# Patient Record
Sex: Male | Born: 1960 | Race: White | Hispanic: No | Marital: Married | Smoking: Never smoker
Health system: Southern US, Community
[De-identification: ages and names within clinical notes are randomized; demographics above are authoritative.]

## PROBLEM LIST (undated history)

## (undated) DIAGNOSIS — M722 Plantar fascial fibromatosis: Secondary | ICD-10-CM

## (undated) DIAGNOSIS — C61 Malignant neoplasm of prostate: Secondary | ICD-10-CM

## (undated) DIAGNOSIS — D869 Sarcoidosis, unspecified: Secondary | ICD-10-CM

## (undated) DIAGNOSIS — N529 Male erectile dysfunction, unspecified: Secondary | ICD-10-CM

## (undated) DIAGNOSIS — R7989 Other specified abnormal findings of blood chemistry: Secondary | ICD-10-CM

## (undated) HISTORY — DX: Male erectile dysfunction, unspecified: N52.9

## (undated) HISTORY — DX: Malignant neoplasm of prostate: C61

## (undated) HISTORY — PX: EYE SURGERY: SHX253

## (undated) HISTORY — DX: Sarcoidosis, unspecified: D86.9

## (undated) HISTORY — DX: Other specified abnormal findings of blood chemistry: R79.89

## (undated) HISTORY — DX: Plantar fascial fibromatosis: M72.2

## (undated) HISTORY — PX: LUNG BIOPSY: SHX232

---

## 1998-09-14 ENCOUNTER — Observation Stay (HOSPITAL_COMMUNITY): Admission: EM | Admit: 1998-09-14 | Discharge: 1998-09-15 | Payer: Self-pay | Admitting: Emergency Medicine

## 1998-09-14 ENCOUNTER — Encounter: Payer: Self-pay | Admitting: Emergency Medicine

## 2008-07-06 ENCOUNTER — Emergency Department (HOSPITAL_COMMUNITY): Admission: EM | Admit: 2008-07-06 | Discharge: 2008-07-07 | Payer: Self-pay | Admitting: Emergency Medicine

## 2008-07-09 ENCOUNTER — Encounter (INDEPENDENT_AMBULATORY_CARE_PROVIDER_SITE_OTHER): Payer: Self-pay | Admitting: *Deleted

## 2008-07-17 ENCOUNTER — Encounter: Payer: Self-pay | Admitting: Family Medicine

## 2008-07-18 ENCOUNTER — Encounter: Admission: RE | Admit: 2008-07-18 | Discharge: 2008-07-18 | Payer: Self-pay | Admitting: Family Medicine

## 2008-07-18 ENCOUNTER — Encounter: Payer: Self-pay | Admitting: Family Medicine

## 2008-08-05 ENCOUNTER — Encounter: Payer: Self-pay | Admitting: Family Medicine

## 2008-08-05 ENCOUNTER — Ambulatory Visit: Payer: Self-pay | Admitting: Thoracic Surgery

## 2008-08-13 ENCOUNTER — Ambulatory Visit (HOSPITAL_COMMUNITY): Admission: RE | Admit: 2008-08-13 | Discharge: 2008-08-13 | Payer: Self-pay | Admitting: Thoracic Surgery

## 2008-08-13 ENCOUNTER — Encounter: Payer: Self-pay | Admitting: Thoracic Surgery

## 2008-08-13 ENCOUNTER — Ambulatory Visit: Payer: Self-pay | Admitting: Thoracic Surgery

## 2008-08-15 ENCOUNTER — Ambulatory Visit: Payer: Self-pay | Admitting: Thoracic Surgery

## 2008-08-15 ENCOUNTER — Encounter: Payer: Self-pay | Admitting: Family Medicine

## 2008-09-16 ENCOUNTER — Ambulatory Visit: Payer: Self-pay | Admitting: Thoracic Surgery

## 2009-04-11 DIAGNOSIS — R7989 Other specified abnormal findings of blood chemistry: Secondary | ICD-10-CM

## 2009-04-11 HISTORY — DX: Other specified abnormal findings of blood chemistry: R79.89

## 2010-01-18 ENCOUNTER — Ambulatory Visit: Payer: Self-pay | Admitting: Family Medicine

## 2010-01-18 DIAGNOSIS — D869 Sarcoidosis, unspecified: Secondary | ICD-10-CM

## 2010-01-18 DIAGNOSIS — M722 Plantar fascial fibromatosis: Secondary | ICD-10-CM

## 2010-01-18 DIAGNOSIS — R6882 Decreased libido: Secondary | ICD-10-CM

## 2010-01-20 LAB — CONVERTED CEMR LAB
TSH: 0.68 microintl units/mL (ref 0.35–5.50)
Testosterone: 278.87 ng/dL — ABNORMAL LOW (ref 350.00–890.00)

## 2010-01-22 ENCOUNTER — Ambulatory Visit: Payer: Self-pay | Admitting: Family Medicine

## 2010-01-22 DIAGNOSIS — E291 Testicular hypofunction: Secondary | ICD-10-CM | POA: Insufficient documentation

## 2010-01-25 LAB — CONVERTED CEMR LAB
ALT: 38 units/L (ref 0–53)
AST: 27 units/L (ref 0–37)
Basophils Relative: 0.5 % (ref 0.0–3.0)
Bilirubin, Direct: 0.1 mg/dL (ref 0.0–0.3)
Chloride: 106 meq/L (ref 96–112)
Eosinophils Relative: 3.8 % (ref 0.0–5.0)
HCT: 45.6 % (ref 39.0–52.0)
Hemoglobin: 15.7 g/dL (ref 13.0–17.0)
Lymphs Abs: 1.8 10*3/uL (ref 0.7–4.0)
MCV: 95.8 fL (ref 78.0–100.0)
Monocytes Absolute: 0.6 10*3/uL (ref 0.1–1.0)
PSA: 0.5 ng/mL (ref 0.10–4.00)
Potassium: 5.1 meq/L (ref 3.5–5.1)
RBC: 4.76 M/uL (ref 4.22–5.81)
Sodium: 141 meq/L (ref 135–145)
Total Protein: 7.2 g/dL (ref 6.0–8.3)
WBC: 4.7 10*3/uL (ref 4.5–10.5)

## 2010-01-26 ENCOUNTER — Ambulatory Visit: Payer: Self-pay | Admitting: Family Medicine

## 2010-03-30 ENCOUNTER — Telehealth: Payer: Self-pay | Admitting: Family Medicine

## 2010-04-28 ENCOUNTER — Other Ambulatory Visit: Payer: Self-pay | Admitting: Family Medicine

## 2010-04-28 ENCOUNTER — Ambulatory Visit
Admission: RE | Admit: 2010-04-28 | Discharge: 2010-04-28 | Payer: Self-pay | Source: Home / Self Care | Attending: Family Medicine | Admitting: Family Medicine

## 2010-04-28 LAB — CBC WITH DIFFERENTIAL/PLATELET
Basophils Absolute: 0.1 10*3/uL (ref 0.0–0.1)
Basophils Relative: 0.9 % (ref 0.0–3.0)
Eosinophils Absolute: 0.5 10*3/uL (ref 0.0–0.7)
Eosinophils Relative: 6.8 % — ABNORMAL HIGH (ref 0.0–5.0)
HCT: 46 % (ref 39.0–52.0)
Hemoglobin: 16 g/dL (ref 13.0–17.0)
Lymphocytes Relative: 38.3 % (ref 12.0–46.0)
Lymphs Abs: 2.9 10*3/uL (ref 0.7–4.0)
MCHC: 34.8 g/dL (ref 30.0–36.0)
MCV: 94.6 fl (ref 78.0–100.0)
Monocytes Absolute: 0.5 10*3/uL (ref 0.1–1.0)
Monocytes Relative: 6.9 % (ref 3.0–12.0)
Neutro Abs: 3.5 10*3/uL (ref 1.4–7.7)
Neutrophils Relative %: 47.1 % (ref 43.0–77.0)
Platelets: 146 10*3/uL — ABNORMAL LOW (ref 150.0–400.0)
RBC: 4.87 Mil/uL (ref 4.22–5.81)
RDW: 12.8 % (ref 11.5–14.6)
WBC: 7.5 10*3/uL (ref 4.5–10.5)

## 2010-04-28 LAB — HEPATIC FUNCTION PANEL
ALT: 63 U/L — ABNORMAL HIGH (ref 0–53)
AST: 38 U/L — ABNORMAL HIGH (ref 0–37)
Albumin: 4 g/dL (ref 3.5–5.2)
Alkaline Phosphatase: 62 U/L (ref 39–117)
Bilirubin, Direct: 0.1 mg/dL (ref 0.0–0.3)
Total Bilirubin: 0.7 mg/dL (ref 0.3–1.2)
Total Protein: 6.9 g/dL (ref 6.0–8.3)

## 2010-04-28 LAB — BASIC METABOLIC PANEL
BUN: 21 mg/dL (ref 6–23)
CO2: 29 mEq/L (ref 19–32)
Calcium: 9.6 mg/dL (ref 8.4–10.5)
Chloride: 108 mEq/L (ref 96–112)
Creatinine, Ser: 1.4 mg/dL (ref 0.4–1.5)
GFR: 55.57 mL/min — ABNORMAL LOW (ref >60.00)
Glucose, Bld: 108 mg/dL — ABNORMAL HIGH (ref 70–99)
Potassium: 4.5 mEq/L (ref 3.5–5.1)
Sodium: 142 mEq/L (ref 135–145)

## 2010-04-28 LAB — PSA: PSA: 0.68 ng/mL (ref 0.10–4.00)

## 2010-04-28 LAB — TESTOSTERONE: Testosterone: 186.78 ng/dL — ABNORMAL LOW (ref 350.00–890.00)

## 2010-04-29 ENCOUNTER — Telehealth: Payer: Self-pay | Admitting: Family Medicine

## 2010-05-04 ENCOUNTER — Telehealth: Payer: Self-pay | Admitting: Family Medicine

## 2010-05-11 NOTE — Letter (Signed)
Summary: Deboraha Sprang @ [place] [facility] [employer]   Imported By: Lanelle Bal 02/03/2010 11:36:59  _____________________________________________________________________  External Attachment:    Type:   Image     Comment:   External Document

## 2010-05-11 NOTE — Assessment & Plan Note (Signed)
Summary: TRANSFER FROM [place]   Vital Signs:  Patient profile:   50 year old male Height:      69.25 inches Weight:      175.75 pounds BMI:     25.86 Temp:     98.3 degrees F oral Pulse rate:   72 / minute Pulse rhythm:   regular BP sitting:   122 / 82  (left arm) Cuff size:   regular  Vitals Entered By: Sydell Axon LPN (January 18, 2010 8:03 AM) CC: New patient to get established   History of Present Illness: Transferring from [place].  H/o foot pain.  Plantar fasciitis.  No pain if he does plantar stretches in AM.    H/o sarcoid per Dr. Edwyna Shell.  No cough, FCNAV, weight loss.   Dec in libido since June.  Happily married but decrease in interest.  Function is still fairly good but drive is the issue.  d/w patient XB:JYNWGNFAOZHY testings today.   Past History:  Past Medical History: Sarcoid Plantar fasciitis low testosterone 2011  Past Surgical History: Surgery on right eye from injury  ~2000 Surgery to have biopsy on Lung 2010- dx'd with sarcoid- Dr. Edwyna Shell  Family History: Reviewed history and no changes required. F alive HTN M alive bone cancer, observation as of 2011  Social History: Reviewed history and no changes required. 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  no murmur PULM: ctab, no inc wob ABD: soft, +bs EXT: no edema SKIN: no acute rash  Genitalia:  Testes bilaterally descended without nodularity, tenderness or masses. No scrotal masses or lesions. No penis lesions or urethral discharge.   Impression & Recommendations:  Problem # 1:  LIBIDO, DECREASED (ICD-799.81) >25 min spent with patient, at least half of which was spent on counseling re:dx. See notes on labs.  Orders: TLB-Testosterone, Total  (84403-TESTO) TLB-TSH (Thyroid Stimulating Hormone) (84443-TSH)  Problem # 2:  PULMONARY SARCOIDOSIS (ICD-135) No symptoms.  Will review records and notify patient.   Complete Medication List: 1)  Ibuprofen 200 Mg Tabs (Ibuprofen) .... As needed  Other Orders: Admin 1st Vaccine (86578) Flu Vaccine 72yrs + [phone])  Patient Instructions: 1)  You can get your results through our phone system.  Follow the instructions on the blue card if you don't hear from [place] first.  2)  I would get a physical next summer.   3)  Take care.  Glad to see you.    Flu Vaccine Consent Questions     Do you have a history of severe allergic reactions to this vaccine? no    Any prior history of allergic reactions to egg and/or gelatin? no    Do you have a sensitivity to the preservative Thimersol? no    Do you have a past history of Guillan-Barre Syndrome? no    Do you currently have an acute febrile illness? no    Have you ever had a severe reaction to latex? no    Vaccine information given and explained to patient? yes    Are you currently pregnant? no    Lot Number:[id number]   Exp Date:10/09/2010   Site Given  Left Deltoid IMflu

## 2010-05-11 NOTE — Assessment & Plan Note (Signed)
Summary: FOLLOW UP FROM LABS/[facility]   Vital Signs:  Patient profile:   50 year old male Height:      69.25 inches Weight:      177.25 pounds BMI:     26.08 Temp:     98.3 degrees F oral Pulse rate:   84 / minute Pulse rhythm:   regular BP sitting:   120 / 74  (left arm) Cuff size:   regular  Vitals Entered By: Delilah Shan CMA Natha Guin Dull) (January 26, 2010 3:18 PM) CC: Follow-up visit from labs   History of Present Illness: Here to discuss labs and options.  Mild increase in glucose and decrease in PLT on labs.  This was preceeded by brief illness and I am unsure if this influenced labs.  See plan re: testosterone replacement.  Recent illness (URI and diarrhea) resolved after  ~24h.   Review of Systems       See HPI.  Otherwise negative.    Physical Exam  General:  A&O, remainder deferred.    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  I d/w pt options: injection, gel, patch.  We talked about adverse effects, not limited to mood change, LFT/CBC/PSA changes.  He opts for patch and I support this.   Will repeat cbc and glucose due to recent labs along with routine labs for med monitoring.  >25 min spent with patient, at least half of which was spent on counseling re:dx, labs, meds, options and plan.  He understands risks and options.  See instructions.   Complete Medication List: 1)  Ibuprofen 200 Mg Tabs (Ibuprofen) .... As needed 2)  Androderm 2.5 Mg/24hr Pt24 (Testosterone) .[facility].. 1 patch applied per day  Patient Instructions: 1)  I want you to come back for repeat fasting CBC, CMET, PSA, testosterone level in 1 months (after using the patches).  dx 257.2. 2)  Let me know if you have any rash or mood changes in the meantime.  3)  Take care.  Prescriptions: ANDRODERM 2.5 MG/24HR PT24 (TESTOSTERONE) 1 patch applied per day  #30 x 5   Entered and Authorized by:   Crawford Givens MD   Signed by:   Crawford Givens MD on 01/26/2010   Method used:   Print then Give to Patient   RxID:   [id number]    Orders Added: 1)  Est. Patient Level IV [56213]

## 2010-05-11 NOTE — Letter (Signed)
Summary: [facility]  [facility]   Imported By: Lanelle Bal 02/03/2010 11:34:19  _____________________________________________________________________  External Attachment:    Type:   Image     Comment:   External Document

## 2010-05-11 NOTE — Letter (Signed)
Summary: [facility]  [facility]   Imported By: Lanelle Bal 02/03/2010 11:33:34  _____________________________________________________________________  External Attachment:    Type:   Image     Comment:   External Document

## 2010-05-13 NOTE — Progress Notes (Signed)
Summary: Testosterone  Phone Note Call from Patient Call back at [phone]   Caller: Patient Call For: Crawford Givens MD Summary of Call: Patient was set up for his 1 month lab appt.  He now states that he will run out of his patches on 05/14/2010 because of doubling up on them.  He says that when he told you he had a month left, he wasn't taking into account that he would be doubling up.  Does he need a refill on the patches and keep the 06/04/2010 lab appt or get labs before 05/14/2010?   Also he says you told him that you didn't care what method he used and he is asking if he could get the gel since the patches are leaving a sticky residue? Initial call taken by: Delilah Shan CMA [name]),  May 04, 2010 11:43 AM  Follow-up for Phone Call        I don't have a preference about what the patient starts on (patch, gel, injection), but once they start on one, they need to stay with it.  I wouldn't change dose and formulation at the same time.  please call in rx for 1 month supply and recheck labs as planned. thanks.  Follow-up by: Crawford Givens MD,  May 04, 2010 11:49 AM  Additional Follow-up for Phone Call Additional follow up Details #1::        Patient Advised. Medication phoned to pharmacy.  Additional Follow-up by: Delilah Shan CMA (AAMA),  May 06, 2010 4:12 PM    Prescriptions: ANDRODERM 2 MG/24HR PT24 (TESTOSTERONE) Two patches applied to skin per day.  #60 x 0   Entered and Authorized by:   Crawford Givens MD   Signed by:   Crawford Givens MD on 05/04/2010   Method used:   Telephoned to ...       [facility] (retail)       [address]       [place], [place]  [address]       Ph: [phone]       Fax: [phone]   RxID:   [id number]

## 2010-05-13 NOTE — Progress Notes (Signed)
Summary: regarding andoderm  Phone Note Refill Request Message from:  Fax from Pharmacy  Refills Requested: Medication #1:  ANDRODERM 2.5 MG/24HR PT24 1 patch applied per day. Fax from [facility] in [place].  This strength has been discontinued and replaced with 2 mg per 24 hours.  Pharmacy is asking to switch to that dose.  Phone [phone]  Initial call taken by: Lowella Petties CMA, AAMA,  March 30, 2010 8:25 AM  Follow-up for Phone Call        please call in testosterone patch 2mg /24h.  apply 1 patch per day.  #30, 1rf.  please update med list.  Pt is due for fasting CBC, CMET, PSA, testosterone level (after using the patches).  dx 257.2.  Make sure it's an early AM sample.  Follow-up by: Crawford Givens MD,  March 30, 2010 10:17 AM  Additional Follow-up for Phone Call Additional follow up Details #1::        Pharmacy advised and Rx. submitted.  Med list updated.  Left message on voicemail  to return call. Lugene Fuquay CMA Duncan Dull)  March 30, 2010 12:11 PM   Patient Advised.  Patient says he will schedule labs at the beginning of the year.  He was advised to be sure to make it an early a.m. appt. Additional Follow-up by: Delilah Shan CMA Duncan Dull),  March 30, 2010 12:36 PM    New/Updated Medications: ANDRODERM 2.5 MG/24HR PT24 (TESTOSTERONE) Replaced with 2 mg./24 hour patch.  One patch applied to skin per day. Prescriptions: ANDRODERM 2.5 MG/24HR PT24 (TESTOSTERONE) Replaced with 2 mg./24 hour patch.  One patch applied to skin per day.  #30 x 1   Entered by:   Delilah Shan CMA (AAMA)   Authorized by:   Crawford Givens MD   Signed by:   Delilah Shan CMA (AAMA) on 03/30/2010   Method used:   Telephoned to ...       [facility] (retail)       [address]       [place], [place]  [address]       Ph: [phone]       Fax: [phone]   RxID:   [id number]

## 2010-05-13 NOTE — Progress Notes (Signed)
  Phone Note Outgoing Call   Summary of Call: I called patient.  Libido was better on the 2.5mg  patch, but it dropped off when he was changed (due to pharmacy repackaging) to the 2mg  patch.  I talked to him about his labs.  His LFTs are minimall elevated.  We agreed to recheck testosterone in 1 month along with LFTs.  He'll increase to 2 of the patches in the meantime.  Please call him to set up lab visit for 1 month from now.  AM testosterone level and LFTs.  dx. 257.2.  thanks.   Initial call taken by: Crawford Givens MD,  April 29, 2010 5:50 PM  Follow-up for Phone Call        Left message on voicemail  to return call. Lugene Fuquay CMA (AAMA)  April 30, 2010 1:02 PM   Patient Advised. Lab appointment scheduled  06/04/2010.   Lugene Fuquay CMA (AAMA)  May 04, 2010 11:40 AM     New/Updated Medications: * ANDRODERM 2 MG/24HR PT24 (TESTOSTERONE) Two patches applied to skin per day.

## 2010-06-04 ENCOUNTER — Encounter (INDEPENDENT_AMBULATORY_CARE_PROVIDER_SITE_OTHER): Payer: Self-pay | Admitting: *Deleted

## 2010-06-04 ENCOUNTER — Other Ambulatory Visit (INDEPENDENT_AMBULATORY_CARE_PROVIDER_SITE_OTHER): Payer: BC Managed Care – PPO

## 2010-06-04 ENCOUNTER — Other Ambulatory Visit: Payer: Self-pay | Admitting: Family Medicine

## 2010-06-04 DIAGNOSIS — R6882 Decreased libido: Secondary | ICD-10-CM

## 2010-06-04 DIAGNOSIS — E291 Testicular hypofunction: Secondary | ICD-10-CM

## 2010-06-04 LAB — HEPATIC FUNCTION PANEL
ALT: 35 U/L (ref 0–53)
Albumin: 4.3 g/dL (ref 3.5–5.2)
Total Bilirubin: 1 mg/dL (ref 0.3–1.2)

## 2010-06-04 LAB — TESTOSTERONE: Testosterone: 250.52 ng/dL — ABNORMAL LOW (ref 350.00–890.00)

## 2010-07-07 ENCOUNTER — Other Ambulatory Visit: Payer: Self-pay | Admitting: Family Medicine

## 2010-07-07 DIAGNOSIS — E291 Testicular hypofunction: Secondary | ICD-10-CM

## 2010-07-08 ENCOUNTER — Other Ambulatory Visit (INDEPENDENT_AMBULATORY_CARE_PROVIDER_SITE_OTHER): Payer: BC Managed Care – PPO | Admitting: Family Medicine

## 2010-07-08 DIAGNOSIS — E291 Testicular hypofunction: Secondary | ICD-10-CM

## 2010-07-08 LAB — TESTOSTERONE: Testosterone: 157.46 ng/dL — ABNORMAL LOW (ref 350.00–890.00)

## 2010-07-08 NOTE — Progress Notes (Signed)
Addended by: Raechel Ache on: 07/08/2010 09:06 PM   Modules accepted: Orders

## 2010-07-09 ENCOUNTER — Telehealth: Payer: Self-pay | Admitting: *Deleted

## 2010-07-11 NOTE — Telephone Encounter (Signed)
It is the testosterone cream 5%.   I believe he has this compounded.  Please see the lab result for the sig and talk to me about this tomorrow.   Thanks.

## 2010-07-12 ENCOUNTER — Telehealth: Payer: Self-pay | Admitting: *Deleted

## 2010-07-12 NOTE — Telephone Encounter (Signed)
Rx  Left at front desk for pick up.

## 2010-07-12 NOTE — Telephone Encounter (Signed)
rx written for testosterone cream 5%, apply 1.5grams daily.  #45 grams, 1rf.

## 2010-07-20 LAB — AFB CULTURE WITH SMEAR (NOT AT ARMC)

## 2010-07-20 LAB — FUNGUS CULTURE W SMEAR: Fungal Smear: NONE SEEN

## 2010-07-20 LAB — CULTURE, RESPIRATORY W GRAM STAIN

## 2010-07-20 LAB — TYPE AND SCREEN

## 2010-07-21 LAB — COMPREHENSIVE METABOLIC PANEL
ALT: 33 U/L (ref 0–53)
AST: 22 U/L (ref 0–37)
Alkaline Phosphatase: 91 U/L (ref 39–117)
CO2: 26 mEq/L (ref 19–32)
Chloride: 106 mEq/L (ref 96–112)
GFR calc Af Amer: >60 mL/min (ref >60)
GFR calc non Af Amer: >60 mL/min (ref >60)
Glucose, Bld: 178 mg/dL — ABNORMAL HIGH (ref 70–99)
Potassium: 3.8 mEq/L (ref 3.5–5.1)
Sodium: 140 mEq/L (ref 135–145)
Total Bilirubin: 0.4 mg/dL (ref 0.3–1.2)

## 2010-07-21 LAB — CBC
Hemoglobin: 13.4 g/dL (ref 13.0–17.0)
MCHC: 34.6 g/dL (ref 30.0–36.0)
RBC: 4.2 MIL/uL — ABNORMAL LOW (ref 4.22–5.81)
WBC: 6.5 10*3/uL (ref 4.0–10.5)

## 2010-07-21 LAB — PROTIME-INR: Prothrombin Time: 13.7 seconds (ref 11.6–15.2)

## 2010-07-22 LAB — CBC
HCT: 45.8 % (ref 39.0–52.0)
MCHC: 35.4 g/dL (ref 30.0–36.0)
MCV: 93.7 fL (ref 78.0–100.0)
Platelets: 232 10*3/uL (ref 150–400)
WBC: 10.2 10*3/uL (ref 4.0–10.5)

## 2010-07-22 LAB — COMPREHENSIVE METABOLIC PANEL
AST: 28 U/L (ref 0–37)
Albumin: 3.8 g/dL (ref 3.5–5.2)
BUN: 19 mg/dL (ref 6–23)
Calcium: 9.1 mg/dL (ref 8.4–10.5)
Chloride: 107 mEq/L (ref 96–112)
Creatinine, Ser: 0.96 mg/dL (ref 0.4–1.5)
GFR calc Af Amer: >60 mL/min (ref >60)
GFR calc non Af Amer: >60 mL/min (ref >60)
Total Bilirubin: 1 mg/dL (ref 0.3–1.2)

## 2010-07-22 LAB — LIPASE, BLOOD: Lipase: 18 U/L (ref 11–59)

## 2010-07-22 LAB — POCT CARDIAC MARKERS
CKMB, poc: <1 ng/mL — ABNORMAL LOW (ref 1.0–8.0)
Myoglobin, poc: 53.8 ng/mL (ref 12–200)

## 2010-07-22 LAB — DIFFERENTIAL
Basophils Absolute: 0 10*3/uL (ref 0.0–0.1)
Lymphocytes Relative: 18 % (ref 12–46)
Lymphs Abs: 1.8 10*3/uL (ref 0.7–4.0)
Neutro Abs: 7.1 10*3/uL (ref 1.7–7.7)

## 2010-07-22 LAB — URINALYSIS, ROUTINE W REFLEX MICROSCOPIC
Nitrite: NEGATIVE
Specific Gravity, Urine: 1.023 (ref 1.005–1.030)
Urobilinogen, UA: 1 mg/dL (ref 0.0–1.0)
pH: 6.5 (ref 5.0–8.0)

## 2010-08-11 ENCOUNTER — Other Ambulatory Visit (INDEPENDENT_AMBULATORY_CARE_PROVIDER_SITE_OTHER): Payer: BC Managed Care – PPO | Admitting: Family Medicine

## 2010-08-11 DIAGNOSIS — E291 Testicular hypofunction: Secondary | ICD-10-CM

## 2010-08-12 ENCOUNTER — Other Ambulatory Visit: Payer: Self-pay | Admitting: Family Medicine

## 2010-08-12 ENCOUNTER — Other Ambulatory Visit: Payer: Self-pay | Admitting: *Deleted

## 2010-08-12 DIAGNOSIS — Z5181 Encounter for therapeutic drug level monitoring: Secondary | ICD-10-CM

## 2010-08-13 ENCOUNTER — Encounter: Payer: Self-pay | Admitting: Family Medicine

## 2010-08-24 NOTE — Op Note (Signed)
NAMEJASEN, Dustin Nixon               ACCOUNT NO.:  [id number]   MEDICAL RECORD NO.:  [id number]          PATIENT TYPE:  AMB   LOCATION:  SDS                          FACILITY:  [facility]   PHYSICIAN:  Ines Bloomer, M.D. DATE OF BIRTH:  1960/05/22   DATE OF PROCEDURE:  08/13/2008  DATE OF DISCHARGE:  08/13/2008                               OPERATIVE REPORT   PREOPERATIVE DIAGNOSIS:  Mediastinal adenopathy.   POSTOPERATIVE DIAGNOSIS:  Mediastinal adenopathy.   OPERATION PERFORMED:  Fiberoptic bronchoscopy, mediastinoscopy, and  endobronchial ultrasound.   After general anesthesia, the video bronchoscope was passed through the  endotracheal tube.  The carina was in the midline and left main stem  bronchus was normal with the left upper lobe at a large takeoff of the  left lingula, which was abnormally enlarged.  On the right side, there  some extra takeoff of the right upper lobe and protruding from this  takeoff were small nodules, which were biopsied and looked to be  inflammatory, but the patient did have two takeoffs of the right upper  lobe with necessary takeoff.  On the right middle lobe and the right  lower lobe, the orifices were normal.  Washings were taken from this  area and cultures, and video bronchoscope was removed.  The EBUS scope  was inserted.  Using the EBUS scope, we identified 10R node to the  takeoff of the right upper lobe main bronchus, and at that time, below  the azygous vein as did 2 aspirations, which showed no cancer, but there  was some questionable granuloma.  We confirmed the diagnosis, the  anterior neck was prepped and draped in usual sterile manner.  A  transverse incision was made and carried down using electrocautery to  subcutaneous tissue and fascia.  The pretracheal fascia was entered and  biopsies of 4 nodes were done.  Strap muscles were closed with 2-0  Vicryl, subcutaneous tissue with 3-0 Vicryl, and Dermabond for the skin.  The patient  returned to recovery room in stable condition.      Ines Bloomer, M.D.  Electronically Signed     DPB/MEDQ  D:  08/13/2008  T:  08/14/2008  Job:  [id number]

## 2010-08-24 NOTE — Letter (Signed)
August 05, 2008   Dwana Curd. Para March, MD  [address]  [place], [place] [address]   Re:  DELDRICK, LINCH               DOB:  [birth year]   Dear Dr. Para March:   I saw the patient in the office today.  This 50 year old patient was  having some problems with plantar fasciitis and had a chest x-ray, which  was done and showed questionable hilar adenopathy.  A CT scan was done  that showed mediastinal and bilateral hilar adenopathy, which he has no  evidence of recent fever.  His weight has been stable.  He does not  smoke.  Because of this he is referred here for evaluation.  There also  were multiple small noncalcified nodules.  This would go along with some  type of infectious process versus sarcoidosis versus possible cancer.His  medications include ibuprofen and Valium.   ALLERGIES:  He has no allergies.   FAMILY HISTORY:  Noncontributory.   SOCIAL HISTORY:  He is married, has one child.  Works as a Merchandiser, retail.  He does not smoke or drink alcohol on a regular basis.   REVIEW OF SYSTEMS:  He is 170 pounds, 5 feet 11 inches.  CARDIAC:  No angina or atrial fibrillation.  PULMONARY:  He has had a cough.  GI:  He has had some dysphagia.  No reflux.  GU:  No kidney disease, dysuria.  VASCULAR:  He has got pain in his legs with walking, has been told he  had plantar fasciitis.  No DVT or TIAs.  NEUROLOGICAL:  No dizziness, headaches, blackouts, seizures.  MUSCULOSKELETAL:  No joint pain.  PSYCHIATRIC:  No depression or nervous.  EYE/ENT:  No changes in eyesight or hearing.  HEMATOLOGIC:  No problems with bleeding, clotting disorders, or anemia.   PHYSICAL EXAMINATION:  He is a well-developed Caucasian male in no acute  distress.  His blood pressure is 149/80, pulse 88, respirations 18,  saturations were 98%.  Head, eyes, ears, nose, and throat:  Unremarkable.  Neck:  Supple without thyromegaly.  There is no  supraclavicular or axillary adenopathy.  Chest:  Clear to auscultation  and percussion.  Heart:  Regular sinus rhythm.  No murmurs.  Abdomen:  Soft.  Extremities:  Pulses are 2+.  There is no clubbing or edema.   I feel he probably has sarcoidosis, but this could possibly be some type  of infectious process or a remote possibility of cancer.  I will plan to  do a bronchoscopy, endobronchial ultrasound, and  possible  mediastinoscopy on him on May 5.   I appreciate the opportunity of seeing the patient.   Sincerely,   Ines Bloomer, M.D.  Electronically Signed   DPB/MEDQ  D:  08/05/2008  T:  08/06/2008  Job:  [id number]

## 2010-08-24 NOTE — Assessment & Plan Note (Signed)
OFFICE VISIT   Dustin, Nixon  DOB:  Aug 13, 1960                                        September 16, 2008  CHART #:  [id number]   The patient came for followup today.  His mediastinoscopy incision is  well healed.  He has not gone back to see his primary care doctor, but  has a diagnosis of sarcoidosis.  He is doing well overall.   His blood pressure was 129/76, pulse 77, respirations 18, sats were 96%.   I encouraged him to follow up with his primary care doctor, and I will  have to see him again if he has any future problems.   Ines Bloomer, M.D.  Electronically Signed   DPB/MEDQ  D:  09/16/2008  T:  09/17/2008  Job:  [id number]   cc:   Dwana Curd. Para March, M.D.

## 2010-08-24 NOTE — Letter (Signed)
Aug 15, 2008   Dustin Nixon. Para March, MD  [address]  [place], [place] [address]   Re:  Dustin Nixon, Dustin Nixon               DOB:  Jun 24, 1960   Dear Dr. Para March:   I saw the patient today.  He is doing well after his bronchoscopy and  mediastinoscopy.  His biopsy shows sarcoidosis or granulomatous  lymphadenitis.  We of course checked cultures, but there were no  organisms seen.  I talked to him about sarcoidosis and referred him back  to you for decisions for his treatment.  It looks like he probably will  not require treatment.  Another option would be to send him to a  pulmonologist regarding his sarcoidosis, but I think this is probably  self-limiting.  I will see him back again in 3 weeks to check on the  healing of his mediastinoscopy wound.   Sincerely,   Ines Bloomer, M.D.  Electronically Signed   DPB/MEDQ  D:  08/15/2008  T:  08/15/2008  Job:  [id number]

## 2010-09-14 ENCOUNTER — Other Ambulatory Visit (INDEPENDENT_AMBULATORY_CARE_PROVIDER_SITE_OTHER): Payer: BC Managed Care – PPO | Admitting: Family Medicine

## 2010-09-14 DIAGNOSIS — Z5181 Encounter for therapeutic drug level monitoring: Secondary | ICD-10-CM

## 2010-09-14 LAB — COMPREHENSIVE METABOLIC PANEL
Alkaline Phosphatase: 59 U/L (ref 39–117)
BUN: 20 mg/dL (ref 6–23)
CO2: 27 mEq/L (ref 19–32)
Creatinine, Ser: 1 mg/dL (ref 0.4–1.5)
GFR: 81.67 mL/min (ref >60.00)
Glucose, Bld: 160 mg/dL — ABNORMAL HIGH (ref 70–99)
Total Bilirubin: 0.7 mg/dL (ref 0.3–1.2)

## 2010-09-14 LAB — CBC WITH DIFFERENTIAL/PLATELET
Basophils Relative: 0.9 % (ref 0.0–3.0)
Eosinophils Relative: 5.8 % — ABNORMAL HIGH (ref 0.0–5.0)
HCT: 49 % (ref 39.0–52.0)
Lymphs Abs: 2.6 10*3/uL (ref 0.7–4.0)
MCV: 95.3 fl (ref 78.0–100.0)
Monocytes Absolute: 0.5 10*3/uL (ref 0.1–1.0)
Monocytes Relative: 7.7 % (ref 3.0–12.0)
RBC: 5.14 Mil/uL (ref 4.22–5.81)
WBC: 6.4 10*3/uL (ref 4.5–10.5)

## 2010-09-14 LAB — TESTOSTERONE: Testosterone: 191.74 ng/dL — ABNORMAL LOW (ref 350.00–890.00)

## 2010-09-14 LAB — PSA: PSA: 0.78 ng/mL (ref 0.10–4.00)

## 2010-09-15 ENCOUNTER — Telehealth: Payer: Self-pay | Admitting: Family Medicine

## 2010-09-15 DIAGNOSIS — E291 Testicular hypofunction: Secondary | ICD-10-CM

## 2010-09-15 NOTE — Telephone Encounter (Signed)
Please call pt. His labs are fine except for the testosterone level.  It's still low. I would inc the testosterone dose.  Please notify patient.  Please call in the Testosterone 5% cream apply 3 grams per day. #90g, 5rf. I would recheck labs in 1 month. I put in the orders. Thanks.  Please adjust the med list.

## 2010-09-16 ENCOUNTER — Encounter: Payer: Self-pay | Admitting: Family Medicine

## 2010-09-16 NOTE — Telephone Encounter (Signed)
Patient notified. New Rx called into [place] as requested. Lab appt scheduled for 1 month.

## 2010-09-16 NOTE — Telephone Encounter (Signed)
Message left for patient to return my call.  

## 2010-10-15 ENCOUNTER — Other Ambulatory Visit (INDEPENDENT_AMBULATORY_CARE_PROVIDER_SITE_OTHER): Payer: BC Managed Care – PPO | Admitting: Family Medicine

## 2010-10-15 DIAGNOSIS — E291 Testicular hypofunction: Secondary | ICD-10-CM

## 2010-10-15 LAB — TESTOSTERONE: Testosterone: 186.47 ng/dL — ABNORMAL LOW (ref 350.00–890.00)

## 2010-10-25 NOTE — Progress Notes (Signed)
  Subjective:    Patient ID: Dustin Nixon, male    DOB: 11/25/1960, 50 y.o.   MRN: [id number]  HPI    Review of Systems     Objective:   Physical Exam        Assessment & Plan:  I called pt.  He agreed with the referral.  I ordered it.  App uro input.

## 2010-10-25 NOTE — Progress Notes (Signed)
Addended by: Lars Mage on: 10/25/2010 05:59 PM   Modules accepted: Orders

## 2011-04-06 ENCOUNTER — Encounter: Payer: Self-pay | Admitting: Family Medicine

## 2012-06-19 ENCOUNTER — Telehealth: Payer: Self-pay | Admitting: Family Medicine

## 2012-06-19 NOTE — Telephone Encounter (Signed)
Mr. Challis dropped off an insurance form to be filled out.

## 2012-06-19 NOTE — Telephone Encounter (Signed)
Form signed, please send requested info.

## 2012-12-04 ENCOUNTER — Ambulatory Visit (INDEPENDENT_AMBULATORY_CARE_PROVIDER_SITE_OTHER): Payer: Managed Care, Other (non HMO) | Admitting: Family Medicine

## 2012-12-04 ENCOUNTER — Encounter: Payer: Self-pay | Admitting: Family Medicine

## 2012-12-04 VITALS — BP 110/62 | HR 82 | Temp 97.8°F | Wt 172.8 lb

## 2012-12-04 DIAGNOSIS — N529 Male erectile dysfunction, unspecified: Secondary | ICD-10-CM

## 2012-12-04 DIAGNOSIS — M545 Low back pain, unspecified: Secondary | ICD-10-CM | POA: Insufficient documentation

## 2012-12-04 DIAGNOSIS — E349 Endocrine disorder, unspecified: Secondary | ICD-10-CM

## 2012-12-04 DIAGNOSIS — E291 Testicular hypofunction: Secondary | ICD-10-CM

## 2012-12-04 LAB — CBC WITH DIFFERENTIAL/PLATELET
Basophils Relative: 0.6 % (ref 0.0–3.0)
Eosinophils Absolute: 0.3 10*3/uL (ref 0.0–0.7)
Eosinophils Relative: 3.6 % (ref 0.0–5.0)
HCT: 45.6 % (ref 39.0–52.0)
Lymphs Abs: 1.9 10*3/uL (ref 0.7–4.0)
MCHC: 35 g/dL (ref 30.0–36.0)
MCV: 92.5 fl (ref 78.0–100.0)
Monocytes Absolute: 0.5 10*3/uL (ref 0.1–1.0)
Neutrophils Relative %: 63.3 % (ref 43.0–77.0)
RBC: 4.93 Mil/uL (ref 4.22–5.81)

## 2012-12-04 LAB — COMPREHENSIVE METABOLIC PANEL
ALT: 41 U/L (ref 0–53)
AST: 26 U/L (ref 0–37)
CO2: 28 mEq/L (ref 19–32)
Calcium: 9.2 mg/dL (ref 8.4–10.5)
Chloride: 106 mEq/L (ref 96–112)
GFR: 80.93 mL/min (ref >60.00)
Sodium: 140 mEq/L (ref 135–145)
Total Protein: 7 g/dL (ref 6.0–8.3)

## 2012-12-04 MED ORDER — TADALAFIL 20 MG PO TABS: 10.0000 mg | ORAL_TABLET | Freq: Every day | ORAL | Status: DC | PRN

## 2012-12-04 NOTE — Progress Notes (Signed)
He wanted to come here to discuss/for T replacement and ED.  Had seen [facility] prev.    ED- no ADE, good effect.  No CP, SOB, lightheaded.  Used prn.    Low T.  Has been off medicine since wife changed jobs.  He doesn't feel as bad as the first time he started the medicine.  He is noting some fatigue, but not as much as prev before treatment.  We talked about risk and benefits of tx.    H/o quick onset lower back pain.  None now.  Likely sciatica pain with radicular component.  Prev seen at [facility] and resolved with prednisone.    Meds, vitals, and allergies reviewed.   ROS: See HPI.  Otherwise, noncontributory.  nad ncat rrr ctab abd soft Ext w/o edema Gait wnl

## 2012-12-04 NOTE — Assessment & Plan Note (Signed)
None now, d/w pt about stretching and proper lifting.  F/u prn.

## 2012-12-04 NOTE — Assessment & Plan Note (Signed)
He doesn't feel as poorly as he did before treatment.  Off T now.  Will check labs today and we'll go from there.  He agrees.  If he doesn't have sig sx and a low level, I wouldn't treatment.  He agrees.  D/w pt about risks and benefits of T tx, esp prostate cancer.

## 2012-12-04 NOTE — Patient Instructions (Addendum)
Go to the lab on the way out.  We'll contact you with your lab report. Stretch your lower back.

## 2012-12-04 NOTE — Assessment & Plan Note (Signed)
Continue cialis, rx sent.  No ADE.

## 2012-12-11 ENCOUNTER — Ambulatory Visit (INDEPENDENT_AMBULATORY_CARE_PROVIDER_SITE_OTHER): Payer: Managed Care, Other (non HMO) | Admitting: Family Medicine

## 2012-12-11 ENCOUNTER — Encounter: Payer: Self-pay | Admitting: Family Medicine

## 2012-12-11 VITALS — BP 114/68 | HR 65 | Temp 98.0°F | Ht 69.25 in | Wt 171.5 lb

## 2012-12-11 DIAGNOSIS — R7309 Other abnormal glucose: Secondary | ICD-10-CM | POA: Insufficient documentation

## 2012-12-11 DIAGNOSIS — R972 Elevated prostate specific antigen [PSA]: Secondary | ICD-10-CM

## 2012-12-11 DIAGNOSIS — R739 Hyperglycemia, unspecified: Secondary | ICD-10-CM

## 2012-12-11 NOTE — Patient Instructions (Addendum)
Go to the lab on the way out.  We'll contact you with your lab report. No charge for visit - make this like a lab visit.  Take care.

## 2012-12-11 NOTE — Assessment & Plan Note (Signed)
Recheck fasting today.

## 2012-12-11 NOTE — Progress Notes (Signed)
PSA was up on last check.  Here for recheck.  No LUTS.  Off testosterone for ~8 months. FH prostate cancer noted.   Last sugar wasn't fasting but was elevated.  Due for recheck today.   Meds, vitals, and allergies reviewed.   ROS: See HPI.  Otherwise, noncontributory.  nad Prostate gland firm and smooth, no enlargement, nodularity, tenderness, mass, asymmetry or induration.

## 2012-12-11 NOTE — Assessment & Plan Note (Signed)
DRE wnl.  Recheck PSA today.  No T treatment until this is resolved.  If elevated or similar to prev, then likely uro referral.  If lower, we can follow PSA here.  He agrees. No charge for clinic visit, would treat as lab visit.

## 2012-12-12 ENCOUNTER — Other Ambulatory Visit: Payer: Self-pay | Admitting: Family Medicine

## 2012-12-12 DIAGNOSIS — R972 Elevated prostate specific antigen [PSA]: Secondary | ICD-10-CM

## 2013-01-04 ENCOUNTER — Encounter: Payer: Self-pay | Admitting: Internal Medicine

## 2013-01-04 ENCOUNTER — Ambulatory Visit (INDEPENDENT_AMBULATORY_CARE_PROVIDER_SITE_OTHER): Payer: Managed Care, Other (non HMO) | Admitting: Internal Medicine

## 2013-01-04 VITALS — BP 140/80 | HR 94 | Temp 98.5°F | Wt 173.0 lb

## 2013-01-04 DIAGNOSIS — L03011 Cellulitis of right finger: Secondary | ICD-10-CM | POA: Insufficient documentation

## 2013-01-04 DIAGNOSIS — L02519 Cutaneous abscess of unspecified hand: Secondary | ICD-10-CM

## 2013-01-04 MED ORDER — AMOXICILLIN-POT CLAVULANATE 875-125 MG PO TABS: 1.0000 | ORAL_TABLET | Freq: Two times a day (BID) | ORAL | Status: DC

## 2013-01-04 NOTE — Assessment & Plan Note (Signed)
Very slow pace but seems like probable bacterial infection  Will treat with augmentin Warm soaks

## 2013-01-04 NOTE — Progress Notes (Signed)
  Subjective:    Patient ID: Dustin Nixon, male    DOB: [birth year], 52 y.o.   MRN: [id number]  HPI Having some puffiness or right 3rd finger Noted it more distally about 5 days ago Seemed okay for a while but now worsening Extended down to PIP now  Tender to touch but not really painful Small cut near nailbed before this started No nail discharge   Hasn't tried any treatment  Current Outpatient Prescriptions on File Prior to Visit  Medication Sig Dispense Refill  . tadalafil (CIALIS) 20 MG tablet Take 0.5 tablets (10 mg total) by mouth daily as needed for erectile dysfunction.  6 tablet  12   No current facility-administered medications on file prior to visit.    No Known Allergies  Past Medical History  Diagnosis Date  . Sarcoid   . Plantar fasciitis   . Low testosterone 2011  . Erectile dysfunction     Past Surgical History  Procedure Laterality Date  . Eye surgery  ~2000    Right eye from injury  . Lung biopsy      sarcoid    Family History  Problem Relation Age of Onset  . Cancer Mother     bone cancer  . Hypertension Father   . Prostate cancer Maternal Uncle   . Prostate cancer Paternal Uncle   . Colon cancer Neg Hx     History   Social History  . Marital Status: Married    Spouse Name: N/A    Number of Children: N/A  . Years of Education: N/A   Occupational History  . Not on file.   Social History Main Topics  . Smoking status: Never Smoker   . Smokeless tobacco: Never Used  . Alcohol Use: Yes     Comment: rare  . Drug Use: No  . Sexual Activity: Not on file   Other Topics Concern  . 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     Enjoys hunting, playing guitar   Married 1986   Review of Systems No fever Feels well No GI symptoms    Objective:   Physical Exam  Constitutional: He appears well-developed. No distress.  Skin:  Healed cut on distal extensor right 3rd finger Redness  proximally to PIP with some increased redness at PIP Mild tenderness and warmth          Assessment & Plan:

## 2013-01-29 ENCOUNTER — Other Ambulatory Visit: Payer: Managed Care, Other (non HMO)

## 2013-01-30 ENCOUNTER — Other Ambulatory Visit (INDEPENDENT_AMBULATORY_CARE_PROVIDER_SITE_OTHER): Payer: Managed Care, Other (non HMO)

## 2013-01-30 DIAGNOSIS — R972 Elevated prostate specific antigen [PSA]: Secondary | ICD-10-CM

## 2013-02-14 ENCOUNTER — Other Ambulatory Visit: Payer: Self-pay

## 2015-03-31 ENCOUNTER — Ambulatory Visit (INDEPENDENT_AMBULATORY_CARE_PROVIDER_SITE_OTHER): Payer: Managed Care, Other (non HMO) | Admitting: Family Medicine

## 2015-03-31 ENCOUNTER — Other Ambulatory Visit: Payer: Self-pay | Admitting: Family Medicine

## 2015-03-31 ENCOUNTER — Ambulatory Visit (INDEPENDENT_AMBULATORY_CARE_PROVIDER_SITE_OTHER)
Admission: RE | Admit: 2015-03-31 | Discharge: 2015-03-31 | Disposition: A | Discharge status: Home / Self Care | Payer: Managed Care, Other (non HMO) | Source: Ambulatory Visit | Attending: Family Medicine | Admitting: Family Medicine

## 2015-03-31 ENCOUNTER — Encounter: Payer: Self-pay | Admitting: Family Medicine

## 2015-03-31 VITALS — BP 142/80 | HR 68 | Temp 97.7°F | Wt 170.0 lb

## 2015-03-31 DIAGNOSIS — Z131 Encounter for screening for diabetes mellitus: Secondary | ICD-10-CM

## 2015-03-31 DIAGNOSIS — Z13220 Encounter for screening for lipoid disorders: Secondary | ICD-10-CM

## 2015-03-31 DIAGNOSIS — Z0000 Encounter for general adult medical examination without abnormal findings: Secondary | ICD-10-CM | POA: Diagnosis not present

## 2015-03-31 DIAGNOSIS — Z23 Encounter for immunization: Secondary | ICD-10-CM

## 2015-03-31 DIAGNOSIS — Z125 Encounter for screening for malignant neoplasm of prostate: Secondary | ICD-10-CM | POA: Diagnosis not present

## 2015-03-31 DIAGNOSIS — M545 Low back pain, unspecified: Secondary | ICD-10-CM

## 2015-03-31 DIAGNOSIS — Z7189 Other specified counseling: Secondary | ICD-10-CM

## 2015-03-31 DIAGNOSIS — Z119 Encounter for screening for infectious and parasitic diseases, unspecified: Secondary | ICD-10-CM

## 2015-03-31 DIAGNOSIS — Z1211 Encounter for screening for malignant neoplasm of colon: Secondary | ICD-10-CM

## 2015-03-31 MED ORDER — TADALAFIL 20 MG PO TABS: 10.0000 mg | ORAL_TABLET | Freq: Every day | ORAL | Status: DC | PRN

## 2015-03-31 NOTE — Patient Instructions (Addendum)
Go to the lab on the way out.  We'll contact you with your lab and xray report. Take care.  Glad to see you.   

## 2015-03-31 NOTE — Progress Notes (Signed)
Pre visit review using our clinic review tool, if applicable. No additional management support is needed unless otherwise documented below in the visit note.  CPE- See plan.  Routine anticipatory guidance given to patient.  See health maintenance. Tetanus 2016 Flu 2016 PNA and shingles note due, d/w pt D/w patient [id number] for colon cancer screening, including IFOB vs. colonoscopy.  Risks and benefits of both were discussed and patient voiced understanding.  Pt elects for: IFOB.  Prostate cancer screening and PSA options (with potential risks and benefits of testing vs not testing) were discussed along with recent recs/guidelines.  He opted for testing PSA at this point. Living will d/w pt.  Wife designated if patient were incapacitated.   Diet and exercise.  Encouraged both.  "My diet is okay."  Pt opts in for HCV and HIV screening.  D/w pt re: routine screening.    ED controlled with cialis.  No ADE on med.   Lower back pain started last year.  Was injected at the time.  It usually doesn't go away fully, unless he has a few days off of work consecutively.  2/10 pain usually, when present.  R side of lower back.  No sciatica, no rash. No midline pain.  No FCNAVD. No weight loss   PMH and SH reviewed  Meds, vitals, and allergies reviewed.   ROS: See HPI.  Otherwise negative.    GEN: nad, alert and oriented HEENT: mucous membranes moist NECK: supple w/o LA CV: rrr. PULM: ctab, no inc wob ABD: soft, +bs EXT: no edema SKIN: no acute rash Lower back ttp on R side, not midline.  SI not painful on testing.  Able to bear weight. S/S wnl BLE

## 2015-04-01 DIAGNOSIS — Z7189 Other specified counseling: Secondary | ICD-10-CM | POA: Insufficient documentation

## 2015-04-01 DIAGNOSIS — Z0000 Encounter for general adult medical examination without abnormal findings: Secondary | ICD-10-CM | POA: Insufficient documentation

## 2015-04-01 LAB — LIPID PANEL
CHOL/HDL RATIO: 3
Cholesterol: 193 mg/dL (ref 0–200)
HDL: 63.3 mg/dL (ref >39.00)
LDL CALC: 122 mg/dL — AB (ref 0–99)
NonHDL: 129.37
TRIGLYCERIDES: 38 mg/dL (ref 0.0–149.0)
VLDL: 7.6 mg/dL (ref 0.0–40.0)

## 2015-04-01 LAB — HEPATITIS C ANTIBODY: HCV Ab: NEGATIVE

## 2015-04-01 LAB — HIV ANTIBODY (ROUTINE TESTING W REFLEX): HIV 1&2 Ab, 4th Generation: NONREACTIVE

## 2015-04-01 LAB — PSA: PSA: 0.85 ng/mL (ref 0.10–4.00)

## 2015-04-01 LAB — GLUCOSE, RANDOM: Glucose, Bld: 87 mg/dL (ref 70–99)

## 2015-04-01 NOTE — Assessment & Plan Note (Signed)
Likely a benign recurrent muscle strain.  D/w pt about stretching.  Check plain films in meantime, given duration but would expect nothing other than OA on the films.  D/w pt.

## 2015-04-01 NOTE — Addendum Note (Signed)
Addended by: Emelia Salisbury C on: 04/01/2015 12:27 PM   Modules accepted: Orders

## 2015-04-01 NOTE — Assessment & Plan Note (Signed)
Tetanus 2016 Flu 2016 PNA and shingles note due, d/w pt D/w patient [id number] for colon cancer screening, including IFOB vs. colonoscopy.  Risks and benefits of both were discussed and patient voiced understanding.  Pt elects for: IFOB.  Prostate cancer screening and PSA options (with potential risks and benefits of testing vs not testing) were discussed along with recent recs/guidelines.  He opted for testing PSA at this point. Living will d/w pt.  Wife designated if patient were incapacitated.   Diet and exercise.  Encouraged both.  "My diet is okay."

## 2016-02-12 ENCOUNTER — Ambulatory Visit: Payer: Managed Care, Other (non HMO) | Admitting: Family Medicine

## 2016-02-16 ENCOUNTER — Ambulatory Visit (INDEPENDENT_AMBULATORY_CARE_PROVIDER_SITE_OTHER): Payer: Managed Care, Other (non HMO) | Admitting: Family Medicine

## 2016-02-16 ENCOUNTER — Encounter: Payer: Self-pay | Admitting: Family Medicine

## 2016-02-16 VITALS — BP 142/70 | HR 72 | Temp 98.6°F | Wt 156.0 lb

## 2016-02-16 DIAGNOSIS — R0982 Postnasal drip: Secondary | ICD-10-CM | POA: Diagnosis not present

## 2016-02-16 DIAGNOSIS — Z23 Encounter for immunization: Secondary | ICD-10-CM

## 2016-02-16 MED ORDER — LORATADINE 10 MG PO TABS: 10.0000 mg | ORAL_TABLET | Freq: Every day | ORAL | Status: DC

## 2016-02-16 MED ORDER — FLUTICASONE PROPIONATE 50 MCG/ACT NA SUSP: 2.0000 | Freq: Every day | NASAL | Status: DC

## 2016-02-16 NOTE — Progress Notes (Signed)
Pre visit review using our clinic review tool, if applicable. No additional management support is needed unless otherwise documented below in the visit note. 

## 2016-02-16 NOTE — Patient Instructions (Signed)
Use nasal saline.  Start back on flonase once a day.  Add on claritin.  Should gradually improve.   Take care.  Glad to see you.  Update me as needed.

## 2016-02-16 NOTE — Progress Notes (Signed)
About 1 month ago he had sig URI sx, head cold.  Since then, last 2-3 weeks, with more sx at night.  He feels "something not right" in his nose, with post nasal gtt.  Disrupting his sleep.  2 months ago he was sleeping at baseline, his last month is clearly different from his baseline.  No FCNAVD.  Not SOB.  He can bike up to 15 miles at a time.  Tried pseudophed and mucinex w/o much relief.    Meds, vitals, and allergies reviewed.   ROS: Per HPI unless specifically indicated in ROS section   GEN: nad, alert and oriented HEENT: mucous membranes moist, tm w/o erythema, nasal exam w/o erythema, clear discharge noted,  OP with mild cobblestoning, sinuses not ttp x4 NECK: supple w/o LA, no stridor CV: rrr.   PULM: ctab, no inc wob

## 2016-02-17 DIAGNOSIS — R0982 Postnasal drip: Secondary | ICD-10-CM | POA: Insufficient documentation

## 2016-02-17 NOTE — Assessment & Plan Note (Signed)
Postnasal drip likely causing his symptoms. Nontoxic. Likely not true sleep apnea otherwise. Discussed with patient. Use nasal saline. Add on Flonase. Okay to take Claritin 10 mg a day. Should gradually resolve. He will update me as needed. Sinuses nontender. Does not appear to need antibiotic treatment at this point. Likely just a holdover from the previous illness.

## 2016-04-05 ENCOUNTER — Other Ambulatory Visit (INDEPENDENT_AMBULATORY_CARE_PROVIDER_SITE_OTHER): Payer: Managed Care, Other (non HMO)

## 2016-04-05 ENCOUNTER — Other Ambulatory Visit: Payer: Self-pay | Admitting: Family Medicine

## 2016-04-05 ENCOUNTER — Encounter: Payer: Self-pay | Admitting: Family Medicine

## 2016-04-05 DIAGNOSIS — Z131 Encounter for screening for diabetes mellitus: Secondary | ICD-10-CM | POA: Diagnosis not present

## 2016-04-05 DIAGNOSIS — E7800 Pure hypercholesterolemia, unspecified: Secondary | ICD-10-CM

## 2016-04-05 DIAGNOSIS — Z125 Encounter for screening for malignant neoplasm of prostate: Secondary | ICD-10-CM

## 2016-04-05 LAB — LIPID PANEL
CHOLESTEROL: 164 mg/dL (ref 0–200)
HDL: 55.5 mg/dL (ref >39.00)
LDL Cholesterol: 87 mg/dL (ref 0–99)
NONHDL: 108.78
Total CHOL/HDL Ratio: 3
Triglycerides: 108 mg/dL (ref 0.0–149.0)
VLDL: 21.6 mg/dL (ref 0.0–40.0)

## 2016-04-05 LAB — GLUCOSE, RANDOM: GLUCOSE: 111 mg/dL — AB (ref 70–99)

## 2016-04-05 LAB — PSA: PSA: 1.1 ng/mL (ref 0.10–4.00)

## 2016-04-08 ENCOUNTER — Encounter: Payer: Self-pay | Admitting: Family Medicine

## 2016-04-08 ENCOUNTER — Ambulatory Visit (INDEPENDENT_AMBULATORY_CARE_PROVIDER_SITE_OTHER): Payer: Managed Care, Other (non HMO) | Admitting: Family Medicine

## 2016-04-08 VITALS — BP 122/70 | HR 58 | Temp 97.9°F | Ht 69.0 in | Wt 155.5 lb

## 2016-04-08 DIAGNOSIS — Z0000 Encounter for general adult medical examination without abnormal findings: Secondary | ICD-10-CM | POA: Diagnosis not present

## 2016-04-08 DIAGNOSIS — Z1211 Encounter for screening for malignant neoplasm of colon: Secondary | ICD-10-CM

## 2016-04-08 MED ORDER — TADALAFIL 20 MG PO TABS: 10.0000 mg | ORAL_TABLET | Freq: Every day | ORAL | 12 refills | Status: DC | PRN

## 2016-04-08 NOTE — Patient Instructions (Signed)
Go to the lab on the way out.  We'll contact you with your lab report. Take care.  Glad to see you.  

## 2016-04-08 NOTE — Progress Notes (Signed)
Pre visit review using our clinic review tool, if applicable. No additional management support is needed unless otherwise documented below in the visit note. 

## 2016-04-08 NOTE — Progress Notes (Signed)
CPE- See plan.  Routine anticipatory guidance given to patient.  See health maintenance. Tetanus 2016 Flu 2017 PNA and shingles note due, d/w pt D/w patient [id number] for colon cancer screening, including IFOB vs. colonoscopy.  Risks and benefits of both were discussed and patient voiced understanding.  Pt elects for: IFOB.  PSA wnl.   Living will d/w pt.  Wife designated if patient were incapacitated.   Diet and exercise.  Encouraged both.  "My diet is good." intentional weight loss.   He is mountain biking.   ED controlled.  No ADE on med.    PMH and SH reviewed  Meds, vitals, and allergies reviewed.   ROS: Per HPI.  Unless specifically indicated otherwise in HPI, the patient denies:  General: fever. Eyes: acute vision changes ENT: sore throat Cardiovascular: chest pain Respiratory: SOB GI: vomiting GU: dysuria Musculoskeletal: acute back pain Derm: acute rash Neuro: acute motor dysfunction Psych: worsening mood Endocrine: polydipsia Heme: bleeding Allergy: hayfever  GEN: nad, alert and oriented HEENT: mucous membranes moist NECK: supple w/o LA CV: rrr. PULM: ctab, no inc wob ABD: soft, +bs EXT: no edema SKIN: no acute rash

## 2016-04-11 NOTE — Assessment & Plan Note (Signed)
Tetanus 2016 Flu 2017 PNA and shingles note due, d/w pt D/w patient [id number] for colon cancer screening, including IFOB vs. colonoscopy.  Risks and benefits of both were discussed and patient voiced understanding.  Pt elects for: IFOB.  PSA wnl.   Living will d/w pt.  Wife designated if patient were incapacitated.   Diet and exercise.  Encouraged both.  "My diet is good." intentional weight loss.   He is mountain biking.   ED controlled.  No ADE on med.

## 2016-06-09 ENCOUNTER — Telehealth: Payer: Self-pay

## 2016-06-09 NOTE — Telephone Encounter (Signed)
Pt left v/m; Cialis is not covered by ins and last cost to pt for 6 pills was $400.00.Pt said his ins will not cover any ED medication and request least expensive med for ED to [place]. Pt will be paying out of pocket.Pt request cb.last annual 04/08/16.

## 2016-06-10 MED ORDER — SILDENAFIL CITRATE 20 MG PO TABS: 60.0000 mg | ORAL_TABLET | Freq: Every day | ORAL | 12 refills | Status: DC | PRN

## 2016-06-10 NOTE — Telephone Encounter (Signed)
Patient notified

## 2016-06-10 NOTE — Telephone Encounter (Signed)
Sent. Thanks.   

## 2017-08-30 ENCOUNTER — Other Ambulatory Visit: Payer: Self-pay | Admitting: Family Medicine

## 2017-08-30 NOTE — Telephone Encounter (Signed)
Copied from [place] [phone]. Topic: Quick Communication - Rx Refill/Question >> Aug 30, 2017  4:26 PM Neva Seat wrote: sildenafil (REVATIO) 20 MG tablet  Pt needing refills  [facility] # [id number] [place] - [place] [address] [place] [address] Phone: [phone] Fax: [phone]

## 2017-08-31 MED ORDER — SILDENAFIL CITRATE 20 MG PO TABS: 60.0000 mg | ORAL_TABLET | Freq: Every day | ORAL | 3 refills | Status: DC | PRN

## 2017-08-31 NOTE — Telephone Encounter (Signed)
Sildenafil last refilled # 50 x 12 on 06/10/16.  Last seen 04/08/16 and no future appt scheduled. [facility] [employer] [facility] [employer].

## 2017-08-31 NOTE — Addendum Note (Signed)
Addended by: Helene Shoe on: 08/31/2017 08:46 AM   Modules accepted: Orders

## 2017-08-31 NOTE — Telephone Encounter (Signed)
Contacted pt regarding request for sildenafil; last office visit 04/08/16; he states that he will call back to schedule appointment for physical exam; will route pt request to office for final disposition.  Sildenafil LOV: 04/08/16 Last Refill: 06/10/16 PCP: Dr Damita Dunnings Pharmacy: [facility], [place]

## 2017-08-31 NOTE — Telephone Encounter (Signed)
CPE when possible.  rx sent.  Thanks.

## 2017-08-31 NOTE — Addendum Note (Signed)
Addended by: Tonia Ghent on: 08/31/2017 10:27 PM   Modules accepted: Orders

## 2017-09-01 ENCOUNTER — Encounter: Payer: Self-pay | Admitting: *Deleted

## 2017-09-01 NOTE — Telephone Encounter (Signed)
Letter mailed

## 2017-09-07 ENCOUNTER — Other Ambulatory Visit: Payer: Self-pay | Admitting: Family Medicine

## 2017-09-07 DIAGNOSIS — R7309 Other abnormal glucose: Secondary | ICD-10-CM

## 2017-09-07 DIAGNOSIS — Z125 Encounter for screening for malignant neoplasm of prostate: Secondary | ICD-10-CM

## 2017-09-08 ENCOUNTER — Other Ambulatory Visit (INDEPENDENT_AMBULATORY_CARE_PROVIDER_SITE_OTHER): Payer: Managed Care, Other (non HMO)

## 2017-09-08 DIAGNOSIS — R7309 Other abnormal glucose: Secondary | ICD-10-CM

## 2017-09-08 DIAGNOSIS — Z125 Encounter for screening for malignant neoplasm of prostate: Secondary | ICD-10-CM

## 2017-09-08 LAB — BASIC METABOLIC PANEL
BUN: 17 mg/dL (ref 6–23)
CALCIUM: 9.3 mg/dL (ref 8.4–10.5)
CO2: 28 mEq/L (ref 19–32)
CREATININE: 0.94 mg/dL (ref 0.40–1.50)
Chloride: 107 mEq/L (ref 96–112)
GFR: 88.32 mL/min (ref >60.00)
Glucose, Bld: 117 mg/dL — ABNORMAL HIGH (ref 70–99)
Potassium: 5.4 mEq/L — ABNORMAL HIGH (ref 3.5–5.1)
Sodium: 141 mEq/L (ref 135–145)

## 2017-09-08 LAB — LIPID PANEL
CHOL/HDL RATIO: 3
Cholesterol: 169 mg/dL (ref 0–200)
HDL: 51.9 mg/dL (ref >39.00)
LDL Cholesterol: 106 mg/dL — ABNORMAL HIGH (ref 0–99)
NonHDL: 116.91
TRIGLYCERIDES: 57 mg/dL (ref 0.0–149.0)
VLDL: 11.4 mg/dL (ref 0.0–40.0)

## 2017-09-08 LAB — PSA: PSA: 1.67 ng/mL (ref 0.10–4.00)

## 2017-09-12 ENCOUNTER — Ambulatory Visit (INDEPENDENT_AMBULATORY_CARE_PROVIDER_SITE_OTHER): Payer: Managed Care, Other (non HMO) | Admitting: Family Medicine

## 2017-09-12 ENCOUNTER — Encounter: Payer: Self-pay | Admitting: Family Medicine

## 2017-09-12 VITALS — BP 110/66 | HR 70 | Temp 98.3°F | Ht 69.0 in | Wt 158.8 lb

## 2017-09-12 DIAGNOSIS — Z1211 Encounter for screening for malignant neoplasm of colon: Secondary | ICD-10-CM

## 2017-09-12 DIAGNOSIS — R739 Hyperglycemia, unspecified: Secondary | ICD-10-CM | POA: Diagnosis not present

## 2017-09-12 DIAGNOSIS — R7309 Other abnormal glucose: Secondary | ICD-10-CM

## 2017-09-12 DIAGNOSIS — E875 Hyperkalemia: Secondary | ICD-10-CM

## 2017-09-12 DIAGNOSIS — Z0000 Encounter for general adult medical examination without abnormal findings: Secondary | ICD-10-CM | POA: Diagnosis not present

## 2017-09-12 DIAGNOSIS — Z7189 Other specified counseling: Secondary | ICD-10-CM

## 2017-09-12 DIAGNOSIS — D869 Sarcoidosis, unspecified: Secondary | ICD-10-CM

## 2017-09-12 DIAGNOSIS — N529 Male erectile dysfunction, unspecified: Secondary | ICD-10-CM

## 2017-09-12 MED ORDER — SILDENAFIL CITRATE 20 MG PO TABS: 60.0000 mg | ORAL_TABLET | Freq: Every day | ORAL | 12 refills | Status: DC | PRN

## 2017-09-12 NOTE — Progress Notes (Signed)
CPE- See plan.  Routine anticipatory guidance given to patient.  See health maintenance.  The possibility exists that previously documented standard health maintenance information may have been brought forward from a previous encounter into this note.  If needed, that same information has been updated to reflect the current situation based on today's encounter.    Tetanus 2016 Flu encouraged.  PNA and shingles note due, d/w pt D/w patient [id number] for colon cancer screening, including IFOB vs. colonoscopy. Risks and benefits of both were discussed and patient voiced understanding. Pt elects for: colonoscopy.   PSA wnl.   Living will d/w pt. Wife designated if patient were incapacitated.  Diet and exercise. "My diet is good." Intentional weight loss.   He is mountain biking.    ED some better with sildenafil.  No ADE on med.  used prn.   H/o sarcoid.  No active pulmonary sx.  No cough except for occ due allergies and resolved in the meantime, not SOB.  No hemoptysis.  Prev intentional weight loss with exercise.    Recheck K pending.  D/w pt.    A1c pending given hyperglycemia.   D/w pt.   PMH and SH reviewed  Meds, vitals, and allergies reviewed.   ROS: Per HPI.  Unless specifically indicated otherwise in HPI, the patient denies:  General: fever. Eyes: acute vision changes ENT: sore throat Cardiovascular: chest pain Respiratory: SOB GI: vomiting GU: dysuria Musculoskeletal: acute back pain Derm: acute rash Neuro: acute motor dysfunction Psych: worsening mood Endocrine: polydipsia Heme: bleeding Allergy: hayfever  GEN: nad, alert and oriented HEENT: mucous membranes moist NECK: supple w/o LA CV: rrr. PULM: ctab, no inc wob ABD: soft, +bs EXT: no edema SKIN: no acute rash

## 2017-09-12 NOTE — Patient Instructions (Addendum)
Go to the lab on the way out.  We'll contact you with your lab report. Take care.  Glad to see you.  Update me as needed.   We will call about your referral.  Rosaria Ferries or Azalee Course will call you if you don't see one of them on the way out.

## 2017-09-13 LAB — POTASSIUM: Potassium: 4.7 mEq/L (ref 3.5–5.1)

## 2017-09-13 LAB — HEMOGLOBIN A1C: HEMOGLOBIN A1C: 5.7 % (ref 4.6–6.5)

## 2017-09-13 NOTE — Assessment & Plan Note (Signed)
Continue as needed sildenafil.  No ADE on med.

## 2017-09-13 NOTE — Assessment & Plan Note (Signed)
No active symptoms.  Clear lungs.  Follow-up as needed.

## 2017-09-13 NOTE — Assessment & Plan Note (Signed)
See notes on f/u A1c.

## 2017-09-13 NOTE — Assessment & Plan Note (Signed)
Living will d/w pt.  Wife designated if patient were incapacitated.   ?

## 2017-09-13 NOTE — Assessment & Plan Note (Signed)
Tetanus 2016 Flu encouraged.  PNA and shingles note due, d/w pt D/w patient [id number] for colon cancer screening, including IFOB vs. colonoscopy. Risks and benefits of both were discussed and patient voiced understanding. Pt elects for: colonoscopy.   PSA wnl.   Living will d/w pt. Wife designated if patient were incapacitated.  Diet and exercise. "My diet is good." Intentional weight loss.   He is mountain biking.

## 2017-12-14 ENCOUNTER — Encounter: Payer: Self-pay | Admitting: Family Medicine

## 2019-02-19 ENCOUNTER — Encounter: Payer: Self-pay | Admitting: Family Medicine

## 2019-02-19 ENCOUNTER — Ambulatory Visit: Payer: Managed Care, Other (non HMO) | Admitting: Family Medicine

## 2019-02-19 ENCOUNTER — Ambulatory Visit (INDEPENDENT_AMBULATORY_CARE_PROVIDER_SITE_OTHER)
Admission: RE | Admit: 2019-02-19 | Discharge: 2019-02-19 | Disposition: A | Discharge status: Home / Self Care | Payer: Managed Care, Other (non HMO) | Source: Ambulatory Visit | Attending: Family Medicine | Admitting: Family Medicine

## 2019-02-19 ENCOUNTER — Other Ambulatory Visit: Payer: Self-pay

## 2019-02-19 ENCOUNTER — Telehealth: Payer: Self-pay

## 2019-02-19 VITALS — BP 122/78 | HR 84 | Temp 97.9°F | Ht 69.0 in | Wt 163.2 lb

## 2019-02-19 DIAGNOSIS — L0390 Cellulitis, unspecified: Secondary | ICD-10-CM

## 2019-02-19 DIAGNOSIS — M7989 Other specified soft tissue disorders: Secondary | ICD-10-CM

## 2019-02-19 MED ORDER — DOXYCYCLINE HYCLATE 100 MG PO TABS: 100.0000 mg | ORAL_TABLET | Freq: Two times a day (BID) | ORAL | 0 refills | Status: DC

## 2019-02-19 NOTE — Progress Notes (Signed)
L thumb swelling.  Started 02/14/2019.  No FCNAVD except for 1 day of chills last week, lasted about 2 hours, none in the last 3-4 days.  He didn't recall an injury but had been field dressing two deer recently.  Less painful now compared to prev, with waxing and waning restriction in ROM.  Stiffer in the AM.  H/o distant tendon injury in thumb at baseline.  No h/o gout .   Meds, vitals, and allergies reviewed.   ROS: Per HPI unless specifically indicated in ROS section   nad ncat rrr Left hand with normal inspection except for swelling and erythema on the left thumb.  He has swelling and erythema more on the dorsal side than the palmar side of the thumb.  He has more erythema proximally than he does distally in the thumb.  Distally he still neurovascular intact.  No fluctuant mass.  Normal radial pulse.  He does not have swelling in the hand or on the other fingers of the left hand.

## 2019-02-19 NOTE — Patient Instructions (Signed)
Presumed skin infection, start doxycycline.  Update me tomorrow.  If worse, then go to the ER.  Go to the lab on the way out.  We'll contact you with your lab and xray report.

## 2019-02-19 NOTE — Telephone Encounter (Signed)
Will see at OV.  Thanks.  

## 2019-02-19 NOTE — Telephone Encounter (Signed)
Pt left v/m that he seeked care for infected hand over weekend but hand is no better. I spoke with pt;pt had virtual visit with his ins co. Pt was prescribed mupirocin ointment and area is not any better. 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 No red streaks. No fever. Pt has no covid symptoms, no travel and no known exposure to + covid. Pt scheduled in office appt today at 3:00 and pt will be here at 2:45.FYI to Dr Damita Dunnings.

## 2019-02-20 ENCOUNTER — Telehealth: Payer: Self-pay | Admitting: *Deleted

## 2019-02-20 DIAGNOSIS — L0390 Cellulitis, unspecified: Secondary | ICD-10-CM | POA: Insufficient documentation

## 2019-02-20 LAB — CBC WITH DIFFERENTIAL/PLATELET
Basophils Absolute: 0.1 10*3/uL (ref 0.0–0.1)
Basophils Relative: 0.7 % (ref 0.0–3.0)
Eosinophils Absolute: 0.2 10*3/uL (ref 0.0–0.7)
Eosinophils Relative: 2.8 % (ref 0.0–5.0)
HCT: 46.1 % (ref 39.0–52.0)
Hemoglobin: 15.7 g/dL (ref 13.0–17.0)
Lymphocytes Relative: 30.4 % (ref 12.0–46.0)
Lymphs Abs: 2.6 10*3/uL (ref 0.7–4.0)
MCHC: 34.1 g/dL (ref 30.0–36.0)
MCV: 94.5 fl (ref 78.0–100.0)
Monocytes Absolute: 0.7 10*3/uL (ref 0.1–1.0)
Monocytes Relative: 8.4 % (ref 3.0–12.0)
Neutro Abs: 5 10*3/uL (ref 1.4–7.7)
Neutrophils Relative %: 57.7 % (ref 43.0–77.0)
Platelets: 189 10*3/uL (ref 150.0–400.0)
RBC: 4.88 Mil/uL (ref 4.22–5.81)
RDW: 12.6 % (ref 11.5–15.5)
WBC: 8.6 10*3/uL (ref 4.0–10.5)

## 2019-02-20 LAB — BASIC METABOLIC PANEL
BUN: 17 mg/dL (ref 6–23)
CO2: 27 mEq/L (ref 19–32)
Calcium: 9.7 mg/dL (ref 8.4–10.5)
Chloride: 103 mEq/L (ref 96–112)
Creatinine, Ser: 1.31 mg/dL (ref 0.40–1.50)
GFR: 56.36 mL/min — ABNORMAL LOW (ref >60.00)
Glucose, Bld: 89 mg/dL (ref 70–99)
Potassium: 4.1 mEq/L (ref 3.5–5.1)
Sodium: 141 mEq/L (ref 135–145)

## 2019-02-20 LAB — URIC ACID: Uric Acid, Serum: 6.2 mg/dL (ref 4.0–7.8)

## 2019-02-20 LAB — SEDIMENTATION RATE: Sed Rate: 19 mm/hr (ref 0–20)

## 2019-02-20 NOTE — Assessment & Plan Note (Signed)
Start doxycycline, check routine labs.  See notes on imaging.  Routine cautions given.  At this point still okay for outpatient follow-up.  No indication for incision and drainage at this point.  If he is worse he will go to the emergency room.  He agrees with plan.

## 2019-02-20 NOTE — Telephone Encounter (Signed)
Patient notified as instructed by telephone and verbalized understanding. 

## 2019-02-20 NOTE — Telephone Encounter (Signed)
Patient called stating that he was seen yesterday and was told to call today with an update. Patient stated that he wanted to let Dr. Damita Dunnings know that area looks and feels much better today.

## 2019-02-20 NOTE — Telephone Encounter (Signed)
Please call patient back.  I am glad he is doing better.  Thank you for the update.  Please let her know about his x-ray.  There were no findings on x-ray suggestive of a bone infection.  That is great news.  He did have an old change on the tip of the second finger that may have been from an old remote injury.  He does not have to do anything about this.  That is an incidental finding and unrelated to his thumb.  His labs are fine.  His sugar, kidney, blood counts and gout tests are all okay.  His sed rate is normal and that is reassuring.  I would keep taking antibiotics.  As long as he keeps getting better and his hand eventually heals up, then he does not have to do anything else.  Update me as needed in the meantime.  Thanks.

## 2019-04-20 ENCOUNTER — Encounter: Payer: Self-pay | Admitting: Family Medicine

## 2019-06-14 ENCOUNTER — Ambulatory Visit: Payer: Managed Care, Other (non HMO) | Attending: Internal Medicine

## 2019-06-14 ENCOUNTER — Ambulatory Visit: Payer: Managed Care, Other (non HMO)

## 2019-06-14 DIAGNOSIS — Z23 Encounter for immunization: Secondary | ICD-10-CM | POA: Insufficient documentation

## 2019-06-14 NOTE — Progress Notes (Signed)
   Covid-19 Vaccination Clinic  Name:  Dustin Nixon    MRN: [id number] DOB: 10-31-1960  06/14/2019  [name] was observed post Covid-19 immunization for 15 minutes without incident. He was provided with Vaccine Information Sheet and instruction to access the V-Safe system.   [name] was instructed to call 911 with any severe reactions post vaccine: Marland Kitchen Difficulty breathing  . Swelling of face and throat  . A fast heartbeat  . A bad rash all over body  . Dizziness and weakness

## 2019-07-10 ENCOUNTER — Ambulatory Visit: Payer: Managed Care, Other (non HMO) | Attending: Internal Medicine

## 2019-07-10 DIAGNOSIS — Z23 Encounter for immunization: Secondary | ICD-10-CM

## 2019-07-10 NOTE — Progress Notes (Signed)
   Covid-19 Vaccination Clinic  Name:  Dustin Nixon    MRN: [id number] DOB: September 15, 1960  07/10/2019  Mr. Dustin Nixon was observed post Covid-19 immunization for 15 minutes without incident. He was provided with Vaccine Information Sheet and instruction to access the V-Safe system.   Mr. Dustin Nixon was instructed to call 911 with any severe reactions post vaccine: Marland Kitchen Difficulty breathing  . Swelling of face and throat  . A fast heartbeat  . A bad rash all over body  . Dizziness and weakness   Immunizations Administered    Name Date Dose VIS Date Route   Pfizer COVID-19 Vaccine 07/10/2019  1:56 PM 0.3 mL 03/22/2019 Intramuscular   Manufacturer: [employer], [employer]   Lot: [id number]   [place]: [id number]

## 2020-01-13 ENCOUNTER — Ambulatory Visit (INDEPENDENT_AMBULATORY_CARE_PROVIDER_SITE_OTHER): Payer: Managed Care, Other (non HMO) | Admitting: Family Medicine

## 2020-01-13 ENCOUNTER — Encounter: Payer: Self-pay | Admitting: Family Medicine

## 2020-01-13 ENCOUNTER — Ambulatory Visit (INDEPENDENT_AMBULATORY_CARE_PROVIDER_SITE_OTHER)
Admission: RE | Admit: 2020-01-13 | Discharge: 2020-01-13 | Disposition: A | Discharge status: Home / Self Care | Payer: Managed Care, Other (non HMO) | Source: Ambulatory Visit | Attending: Family Medicine | Admitting: Family Medicine

## 2020-01-13 ENCOUNTER — Other Ambulatory Visit: Payer: Self-pay

## 2020-01-13 VITALS — BP 120/80 | HR 66 | Temp 98.0°F | Ht 69.0 in | Wt 168.0 lb

## 2020-01-13 DIAGNOSIS — Z23 Encounter for immunization: Secondary | ICD-10-CM | POA: Diagnosis not present

## 2020-01-13 DIAGNOSIS — S62114A Nondisplaced fracture of triquetrum [cuneiform] bone, right wrist, initial encounter for closed fracture: Secondary | ICD-10-CM | POA: Diagnosis not present

## 2020-01-13 DIAGNOSIS — M25531 Pain in right wrist: Secondary | ICD-10-CM

## 2020-01-13 DIAGNOSIS — N521 Erectile dysfunction due to diseases classified elsewhere: Secondary | ICD-10-CM | POA: Diagnosis not present

## 2020-01-13 MED ORDER — SILDENAFIL CITRATE 20 MG PO TABS: 60.0000 mg | ORAL_TABLET | Freq: Every day | ORAL | 12 refills | Status: DC | PRN

## 2020-01-13 NOTE — Progress Notes (Signed)
[place] [facility] [name], MD, Dustin Nixon, [address]  Phone: [phone]  FAX: [phone]  [name] - 59 y.o. male  MRN [id number]  Date of Birth: January 10, 1961  Date: 01/13/2020  PCP: Tonia Ghent, MD  Referral: Tonia Ghent, MD  Chief Complaint  Patient presents with  . Fall  . Wrist Pain    Right    This visit occurred during the SARS-CoV-2 public health emergency.  Safety protocols were in place, including screening questions prior to the visit, additional usage of staff PPE, and extensive cleaning of exam room while observing appropriate contact time as indicated for disinfecting solutions.   Subjective:   Dustin Nixon is a 59 y.o. very pleasant male patient with Body mass index is 24.81 kg/m. who presents with the following:  [place] and R wrist pain:  01/12/2020  [place] on R hand. He is a very nice generally healthy gentleman and he fell on his right hand and wrist yesterday in the afternoon. Since then he has developed some mild swelling and pain in predominantly the carpal region. He also has some pain in the proximal carpals on the fourth and fifth, but much less compared to the carpal region.  He has no significant history of prior hand or wrist fracture.  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   Review of Systems is noted in the HPI, as appropriate   Objective:   BP 120/80   Pulse 66   Temp 98 F (36.7 C) (Temporal)   Ht 5\' 9"  (1.753 m)   Wt 168 lb (76.2 kg)   SpO2 97%   BMI 24.81 kg/m    GEN: No acute distress; alert,appropriate. PULM: Breathing comfortably in no respiratory distress PSYCH: Normally interactive.    The right hand: He is nontender throughout all of his digits including nontender at meta carpals as well one through three  with some tenderness at the base of the fourth and fifth. He is able to move his elbow without any restriction. Shoulder movements full range of motion without any restriction.  Does have some pain at the distal radius and ulna. He also has pain in the carpal region relatively diffusely without focal location. Axial loading does cause some significant pain as well as ulnar and radial deviation. He does appear to be neurovascularly intact.  Radiology: DG Wrist Complete Right  Result Date: 01/13/2020 CLINICAL DATA:  Fall.  Wrist pain. EXAM: RIGHT WRIST - COMPLETE 3+ VIEW COMPARISON:  None. FINDINGS: Four views study shows a triquetral fracture on the lateral projection. No other acute fracture. No subluxation or dislocation. Small lucencies in the lunate triquetral and hamate are likely degenerative. IMPRESSION: Isolated triquetral fracture, best visualized on the lateral projection. Electronically Signed   By: Misty Stanley M.D.   On: 01/13/2020 10:35    Assessment and Plan:     ICD-10-CM   1. Closed nondisplaced fracture of triquetrum of right wrist, initial encounter  S62.114A Ambulatory referral to Hand Surgery  2. Need for influenza vaccination  Z23 Flu Vaccine QUAD 6+ mos PF IM (Fluarix Quad PF)  3. Acute pain of right wrist  M25.531 DG Wrist Complete Right  4. Erectile dysfunction due to diseases classified elsewhere  N52.1    Triquetral fracture, right hand, stabilized in a thumb spica forearm  splint. Consult hand surgery for definitive management.  Prior records are reviewed as well as radiographs and independent review of radiographs.  Meds ordered this encounter  Medications  . sildenafil (REVATIO) 20 MG tablet    Sig: Take 3-5 tablets (60-100 mg total) by mouth daily as needed.    Dispense:  50 tablet    Refill:  12   Medications Discontinued During This Encounter  Medication Reason  . doxycycline (VIBRA-TABS) 100 MG tablet Completed Course  . fluticasone (FLONASE) 50  MCG/ACT nasal spray Completed Course  . sildenafil (REVATIO) 20 MG tablet Reorder   Orders Placed This Encounter  Procedures  . DG Wrist Complete Right  . Flu Vaccine QUAD 6+ mos PF IM (Fluarix Quad PF)  . Ambulatory referral to Hand Surgery    Follow-up: No follow-ups on file.  Signed,  Maud Deed. Robby Bulkley, MD   Outpatient Encounter Medications as of 01/13/2020  Medication Sig  . sildenafil (REVATIO) 20 MG tablet Take 3-5 tablets (60-100 mg total) by mouth daily as needed.  . [DISCONTINUED] doxycycline (VIBRA-TABS) 100 MG tablet Take 1 tablet (100 mg total) by mouth 2 (two) times daily.  . [DISCONTINUED] fluticasone (FLONASE) 50 MCG/ACT nasal spray Place 2 sprays into both nostrils daily.  . [DISCONTINUED] sildenafil (REVATIO) 20 MG tablet Take 3-5 tablets (60-100 mg total) by mouth daily as needed. (Patient not taking: Reported on 01/13/2020)   No facility-administered encounter medications on file as of 01/13/2020.

## 2020-12-02 ENCOUNTER — Ambulatory Visit (INDEPENDENT_AMBULATORY_CARE_PROVIDER_SITE_OTHER): Payer: Managed Care, Other (non HMO)

## 2020-12-02 ENCOUNTER — Ambulatory Visit (HOSPITAL_COMMUNITY)
Admission: EM | Admit: 2020-12-02 | Discharge: 2020-12-02 | Disposition: A | Discharge status: Home / Self Care | Payer: Managed Care, Other (non HMO) | Attending: Emergency Medicine | Admitting: Emergency Medicine

## 2020-12-02 ENCOUNTER — Encounter (HOSPITAL_COMMUNITY): Payer: Self-pay

## 2020-12-02 ENCOUNTER — Other Ambulatory Visit: Payer: Self-pay

## 2020-12-02 DIAGNOSIS — T148XXA Other injury of unspecified body region, initial encounter: Secondary | ICD-10-CM

## 2020-12-02 DIAGNOSIS — M545 Low back pain, unspecified: Secondary | ICD-10-CM | POA: Diagnosis not present

## 2020-12-02 MED ORDER — KETOROLAC TROMETHAMINE 30 MG/ML IJ SOLN
30.0000 mg | Freq: Once | INTRAMUSCULAR | Status: AC
  Administered 2020-12-02: 30 mg via INTRAMUSCULAR

## 2020-12-02 MED ORDER — KETOROLAC TROMETHAMINE 30 MG/ML IJ SOLN
INTRAMUSCULAR | Status: AC
  Filled 2020-12-02: qty 1

## 2020-12-02 NOTE — ED Triage Notes (Addendum)
Pt states he hurt his back while lifting something today. Pt states he feels like his body is leaning more towards the right side.

## 2020-12-02 NOTE — ED Provider Notes (Signed)
[place]    CSN: [id number] Arrival date & time: 12/02/20  1600      History   Chief Complaint Chief Complaint  Patient presents with   Back Pain    HPI Dustin Nixon is a 60 y.o. male.   Patient here for evaluation of left-sided back pain that occurred after lifting a heavy object earlier this morning.  Reports having similar symptoms in the past with a bulging disc but states that it has been 6 or more years since.  Reports back pain has improved but will get sharp pains with certain movements.  Also reports that he felt his hips were not aligned like normal.  Denies any fevers, chest pain, shortness of breath, N/V/D, numbness, tingling, weakness, abdominal pain, or headaches.    The history is provided by the patient.  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 Authorizing Provider  sildenafil (REVATIO) 20 MG tablet Take 3-5 tablets (60-100 mg total) by mouth daily as needed. 01/13/20   CoplandFrederico Hamman, MD    Family History Family History  Problem Relation Age of Onset   Parkinsonism Mother    Hypertension Father    Cancer Father        bone cancer   Prostate cancer Maternal Uncle    Prostate cancer Paternal Uncle    Colon cancer Neg Hx     Social History Social History   Tobacco Use   Smoking status: Never   Smokeless  tobacco: Never  Substance Use Topics   Alcohol use: Yes    Comment: rare   Drug use: No     Allergies   Patient has no known allergies.   Review of Systems Review of Systems  Musculoskeletal:  Positive for back pain.  All other systems reviewed and are negative.   Physical Exam Triage Vital Signs ED Triage Vitals  Enc Vitals Group     BP 12/02/20 1729 (!) 172/85     Pulse Rate 12/02/20 1729 88     Resp 12/02/20 1729 18     Temp 12/02/20 1729 97.7 F (36.5 C)     Temp Source 12/02/20 1729 Oral     SpO2 12/02/20 1729 97 %     Weight --      Height --      Head Circumference --      Peak Flow --      Pain Score 12/02/20 1728 10     Pain Loc --      Pain Edu? --      Excl. in [place]? --    No data found.  Updated Vital Signs BP (!) 172/85 (BP Location: Left Arm)   Pulse 88   Temp 97.7 F (36.5 C) (Oral)  Resp 18   SpO2 97%   Visual Acuity Right Eye Distance:   Left Eye Distance:   Bilateral Distance:    Right Eye Near:   Left Eye Near:    Bilateral Near:     Physical Exam Vitals and nursing note reviewed.  Constitutional:      General: He is not in acute distress.    Appearance: Normal appearance. He is not ill-appearing, toxic-appearing or diaphoretic.  HENT:     Head: Normocephalic and atraumatic.  Eyes:     Conjunctiva/sclera: Conjunctivae normal.  Cardiovascular:     Rate and Rhythm: Normal rate.     Pulses: Normal pulses.  Pulmonary:     Effort: Pulmonary effort is normal.  Abdominal:     General: Abdomen is flat.  Musculoskeletal:        General: Normal range of motion.     Cervical back: Normal and normal range of motion.     Thoracic back: Normal.     Lumbar back: Tenderness (left side) present. No swelling, deformity or bony tenderness. Negative right straight leg raise test and negative left straight leg raise test.  Skin:    General: Skin is warm and dry.  Neurological:     General: No focal deficit present.     Mental Status: He  is alert and oriented to person, place, and time.  Psychiatric:        Mood and Affect: Mood normal.     UC Treatments / Results  Labs (all labs ordered are listed, but only abnormal results are displayed) Labs Reviewed - No data to display  EKG   Radiology DG Lumbar Spine Complete  Result Date: 12/02/2020 CLINICAL DATA:  Back pain EXAM: LUMBAR SPINE - COMPLETE 4+ VIEW COMPARISON:  03/31/2015 FINDINGS: Frontal, bilateral oblique, lateral views of the lumbar spine are obtained. There are 5 non-rib-bearing lumbar type vertebral bodies identified, with right convex scoliosis centered at the L2 level measuring approximately 14 degrees. Otherwise alignment is anatomic. There are no acute displaced fractures. Mild diffuse spondylosis most pronounced at the L1-2 and L2-3 levels. Mild facet hypertrophic changes at L5-S1. Sacroiliac joints are normal. IMPRESSION: 1. Right convex scoliosis centered at L2, measuring approximately 14 degrees. 2. Progressive but mild multilevel spondylosis and facet hypertrophy as above. 3. No acute bony abnormalities. Electronically Signed   By: Randa Ngo M.D.   On: 12/02/2020 17:59    Procedures Procedures (including critical care time)  Medications Ordered in UC Medications  ketorolac (TORADOL) 30 MG/ML injection 30 mg (has no administration in time range)    Initial Impression / Assessment and Plan / UC Course  I have reviewed the triage vital signs and the nursing notes.  Pertinent labs & imaging results that were available during my care of the patient were reviewed by me and considered in my medical decision making (see chart for details).    Assessment negative for red flags or concerns.  X-ray with right convex scoliosis, progressive but mild multilevel spondylosis but otherwise no acute bony abnormalities.  Toradol IM given in office for back pain.  Recommend Tylenol and/or ibuprofen as needed for pain.  Encourage fluids and rest.  Discussed  conservative symptom management as described in discharge instructions.  Follow-up as needed Final Clinical Impressions(s) / UC Diagnoses   Final diagnoses:  Acute left-sided low back pain without sciatica  Muscle strain     Discharge Instructions      You can take Tylenol and/or Ibuprofen as needed  for pain relief and fever reduction.   You can use heat, ice, or alternate between heat and ice for comfort.  You can also use IcyHot or lidocaine patches for pain relief.   Drink plenty of fluids and rest.   Return or go to the Emergency Department if symptoms worsen or do not improve in the next few days.      ED Prescriptions   None    PDMP not reviewed this encounter.   Pearson Forster, NP 12/02/20 [phone]

## 2020-12-02 NOTE — Discharge Instructions (Addendum)
You can take Tylenol and/or Ibuprofen as needed for pain relief and fever reduction.   You can use heat, ice, or alternate between heat and ice for comfort.  You can also use IcyHot or lidocaine patches for pain relief.   Drink plenty of fluids and rest.   Return or go to the Emergency Department if symptoms worsen or do not improve in the next few days.

## 2020-12-07 ENCOUNTER — Encounter: Payer: Self-pay | Admitting: Family Medicine

## 2020-12-07 ENCOUNTER — Other Ambulatory Visit: Payer: Self-pay

## 2020-12-07 ENCOUNTER — Ambulatory Visit: Payer: Managed Care, Other (non HMO) | Admitting: Family Medicine

## 2020-12-07 VITALS — BP 132/80 | HR 73 | Temp 97.4°F | Ht 69.0 in | Wt 170.0 lb

## 2020-12-07 DIAGNOSIS — Z1211 Encounter for screening for malignant neoplasm of colon: Secondary | ICD-10-CM | POA: Diagnosis not present

## 2020-12-07 DIAGNOSIS — M545 Low back pain, unspecified: Secondary | ICD-10-CM

## 2020-12-07 DIAGNOSIS — Z659 Problem related to unspecified psychosocial circumstances: Secondary | ICD-10-CM | POA: Diagnosis not present

## 2020-12-07 DIAGNOSIS — D179 Benign lipomatous neoplasm, unspecified: Secondary | ICD-10-CM

## 2020-12-07 NOTE — Progress Notes (Signed)
This visit occurred during the SARS-CoV-2 public health emergency.  Safety protocols were in place, including screening questions prior to the visit, additional usage of staff PPE, and extensive cleaning of exam room while observing appropriate contact time as indicated for disinfecting solutions.  Lesion near base of neck on the R side.  Present for months.  No injury or known trigger.  No pain.  Normal sensation locally.  No redness.    He is changing jobs.  Mother has health concerns.  Stressors noted.  Occ pulse elevation, unclear if from anxiety.  H/o anxiety per patient report.  He wanted to get back to exercise as that may help.  No CP, not SOB.  I asked him to check his pulse with sx.  No SI/HI.    D/w patient [id number] for colon cancer screening, including IFOB vs. colonoscopy.  Risks and benefits of both were discussed and patient voiced understanding.  Pt elects to talk to GI.  Referral placed.   He was concerned about anesthesia with the procedure, d/w pt. I think he should be able to talk to GI about that prior to any procedure.  He had seen UC about back pain.  That is better but still with some R SI area pain that is reproduced with SI stretching and prolonged sitting. Normal B hip ROM.  Recent imaging d/w pt.    Meds, vitals, and allergies reviewed.   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   Rrr Ctab Abd soft not ttp Skin without rash and well perfused. Extremities without edema. Lower back slightly tender near the right SI area.  Able to bear weight.  31 minutes were devoted to patient care in this encounter (this includes time spent reviewing the patient's file/history, interviewing and examining the patient, counseling/reviewing plan with patient).

## 2020-12-07 NOTE — Patient Instructions (Addendum)
We'll call about the GI appointment.  Likely a lipoma on the base of your neck.  Likely a benign spot.  If it gets bigger or if you want it removed, then let me know and we'll set you up with the surgery clinic.   Check your pulse out of clinic.  If above 100 at rest then let me know.   If you notice skipping beats or heart racing then let me know.  Try to gradually get back to exercising and see if that helps.   Take care.  Glad to see you.

## 2020-12-09 DIAGNOSIS — Z659 Problem related to unspecified psychosocial circumstances: Secondary | ICD-10-CM | POA: Insufficient documentation

## 2020-12-09 DIAGNOSIS — Z1211 Encounter for screening for malignant neoplasm of colon: Secondary | ICD-10-CM | POA: Insufficient documentation

## 2020-12-09 DIAGNOSIS — D179 Benign lipomatous neoplasm, unspecified: Secondary | ICD-10-CM | POA: Insufficient documentation

## 2020-12-09 NOTE — Assessment & Plan Note (Signed)
Likely lipoma No reason to suspect an ominous diagnosis. If it gets bigger or if he wants it removed, then he can let me know and we'll set him up with the surgery clinic.

## 2020-12-09 NOTE — Assessment & Plan Note (Signed)
See above, with plan to return to exercise.  I asked him to check his pulse if he had any more symptoms and to see if he felt better after gradual return to exercise.  Still okay for outpatient follow-up and he will update me as needed.

## 2020-12-09 NOTE — Assessment & Plan Note (Signed)
Improved, would expect this to resolve.

## 2020-12-09 NOTE — Assessment & Plan Note (Signed)
Refer to GI 

## 2021-11-03 ENCOUNTER — Other Ambulatory Visit: Payer: Self-pay | Admitting: Family Medicine

## 2021-11-03 DIAGNOSIS — Z125 Encounter for screening for malignant neoplasm of prostate: Secondary | ICD-10-CM

## 2021-11-03 DIAGNOSIS — E785 Hyperlipidemia, unspecified: Secondary | ICD-10-CM

## 2021-11-04 ENCOUNTER — Other Ambulatory Visit (INDEPENDENT_AMBULATORY_CARE_PROVIDER_SITE_OTHER): Payer: No Typology Code available for payment source

## 2021-11-04 DIAGNOSIS — Z125 Encounter for screening for malignant neoplasm of prostate: Secondary | ICD-10-CM | POA: Diagnosis not present

## 2021-11-04 DIAGNOSIS — E785 Hyperlipidemia, unspecified: Secondary | ICD-10-CM | POA: Diagnosis not present

## 2021-11-05 LAB — LIPID PANEL
Cholesterol: 158 mg/dL (ref 0–200)
HDL: 54.1 mg/dL (ref >39.00)
LDL Cholesterol: 91 mg/dL (ref 0–99)
NonHDL: 103.69
Total CHOL/HDL Ratio: 3
Triglycerides: 64 mg/dL (ref 0.0–149.0)
VLDL: 12.8 mg/dL (ref 0.0–40.0)

## 2021-11-05 LAB — COMPREHENSIVE METABOLIC PANEL
ALT: 40 U/L (ref 0–53)
AST: 24 U/L (ref 0–37)
Albumin: 4.4 g/dL (ref 3.5–5.2)
Alkaline Phosphatase: 71 U/L (ref 39–117)
BUN: 21 mg/dL (ref 6–23)
CO2: 27 mEq/L (ref 19–32)
Calcium: 9.3 mg/dL (ref 8.4–10.5)
Chloride: 103 mEq/L (ref 96–112)
Creatinine, Ser: 0.95 mg/dL (ref 0.40–1.50)
GFR: 87.38 mL/min (ref >60.00)
Glucose, Bld: 82 mg/dL (ref 70–99)
Potassium: 4.1 mEq/L (ref 3.5–5.1)
Sodium: 139 mEq/L (ref 135–145)
Total Bilirubin: 1 mg/dL (ref 0.2–1.2)
Total Protein: 7.4 g/dL (ref 6.0–8.3)

## 2021-11-05 LAB — PSA: PSA: 2.74 ng/mL (ref 0.10–4.00)

## 2021-11-09 ENCOUNTER — Encounter: Payer: Self-pay | Admitting: Family Medicine

## 2021-11-09 ENCOUNTER — Ambulatory Visit (INDEPENDENT_AMBULATORY_CARE_PROVIDER_SITE_OTHER): Payer: No Typology Code available for payment source | Admitting: Family Medicine

## 2021-11-09 VITALS — BP 124/70 | HR 69 | Temp 97.8°F | Ht 69.0 in | Wt 166.0 lb

## 2021-11-09 DIAGNOSIS — Z7189 Other specified counseling: Secondary | ICD-10-CM

## 2021-11-09 DIAGNOSIS — N521 Erectile dysfunction due to diseases classified elsewhere: Secondary | ICD-10-CM

## 2021-11-09 DIAGNOSIS — Z1211 Encounter for screening for malignant neoplasm of colon: Secondary | ICD-10-CM

## 2021-11-09 DIAGNOSIS — Z0000 Encounter for general adult medical examination without abnormal findings: Secondary | ICD-10-CM | POA: Diagnosis not present

## 2021-11-09 MED ORDER — SILDENAFIL CITRATE 20 MG PO TABS: 60.0000 mg | ORAL_TABLET | Freq: Every day | ORAL | 12 refills | Status: DC | PRN

## 2021-11-09 NOTE — Progress Notes (Unsigned)
CPE- See plan.  Routine anticipatory guidance given to patient.  See health maintenance.  The possibility exists that previously documented standard health maintenance information may have been brought forward from a previous encounter into this note.  If needed, that same information has been updated to reflect the current situation based on today's encounter.    Tetanus 2016 Flu encouraged.  PNA and shingles note due, d/w pt Covid vaccine prev done.   D/w patient [id number] for colon cancer screening, including IFOB vs. colonoscopy.  Risks and benefits of both were discussed and patient voiced understanding.  Pt elects for: colonoscopy.   PSA wnl.   Living will d/w pt.  Wife designated if patient were incapacitated.   Diet and exercise d/w pt.    H/o sarcoid.  No cough.  No suggestive sx.    Sildenafil helped prev, on ADE on med.    PMH and SH reviewed  Meds, vitals, and allergies reviewed.   ROS: Per HPI.  Unless specifically indicated otherwise in HPI, the patient denies:  General: fever. Eyes: acute vision changes ENT: sore throat Cardiovascular: chest pain Respiratory: SOB GI: vomiting GU: dysuria Musculoskeletal: acute back pain Derm: acute rash Neuro: acute motor dysfunction Psych: worsening mood Endocrine: polydipsia Heme: bleeding Allergy: hayfever  GEN: nad, alert and oriented HEENT: ncat NECK: supple w/o LA CV: rrr. PULM: ctab, no inc wob ABD: soft, +bs EXT: no edema SKIN: no acute rash

## 2021-11-09 NOTE — Patient Instructions (Addendum)
Update me as needed.   Recheck in summer 2024.  Talk to GI.  Take care.  Glad to see you.

## 2021-11-10 ENCOUNTER — Telehealth: Payer: Self-pay | Admitting: Family Medicine

## 2021-11-10 MED ORDER — SILDENAFIL CITRATE 20 MG PO TABS: 60.0000 mg | ORAL_TABLET | Freq: Every day | ORAL | 12 refills | Status: DC | PRN

## 2021-11-10 NOTE — Assessment & Plan Note (Signed)
Living will d/w pt.  Wife designated if patient were incapacitated.   ?

## 2021-11-10 NOTE — Assessment & Plan Note (Signed)
Tetanus 2016 Flu encouraged.  PNA and shingles note due, d/w pt Covid vaccine prev done.   D/w patient [id number] for colon cancer screening, including IFOB vs. colonoscopy.  Risks and benefits of both were discussed and patient voiced understanding.  Pt elects for: colonoscopy.   PSA wnl.   Living will d/w pt.  Wife designated if patient were incapacitated.   Diet and exercise d/w pt.

## 2021-11-10 NOTE — Telephone Encounter (Signed)
Erx sent

## 2021-11-10 NOTE — Assessment & Plan Note (Signed)
Sildenafil helped prev, on ADE on med.   Continue as needed use.

## 2021-11-10 NOTE — Telephone Encounter (Signed)
Patient called and asked if the sildenafil (REVATIO) 20 MG tablet can be sent to the [facility] in [place] on [address], [place], [place] [address]

## 2021-11-19 ENCOUNTER — Ambulatory Visit: Payer: No Typology Code available for payment source | Admitting: Nurse Practitioner

## 2021-11-19 VITALS — BP 164/88 | HR 89 | Temp 96.9°F | Resp 10 | Ht 69.0 in | Wt 165.2 lb

## 2021-11-19 DIAGNOSIS — R195 Other fecal abnormalities: Secondary | ICD-10-CM | POA: Diagnosis not present

## 2021-11-19 MED ORDER — SILDENAFIL CITRATE 20 MG PO TABS: 60.0000 mg | ORAL_TABLET | Freq: Every day | ORAL | 12 refills | Status: DC | PRN

## 2021-11-19 NOTE — Progress Notes (Signed)
   Acute Office Visit  Subjective:     Patient ID: Dustin Nixon, male    DOB: 1961/02/02, 61 y.o.   MRN: [id number]  Chief Complaint  Patient presents with   parasite in the stool possibly    Thinks he saw a parasite in the stool, long, white, but was not moving. Had a 3 second abdominal cramp yesterday, had some constipation for the past 3 to 4 days. No nausea, no vomiting, no blood in the stool or urine.    HPI Patient is in today for Stool abnormality  States that he had a bowel movement yesterday and noticed something in the stool. On the outside. States that for the last 4 days he was constipated. No international travel.  No undercook meat No anal itching. 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 States that he had an instance later the same day. States after he had a 2 second time of lower abdominal cramping  Review of Systems  Constitutional:  Negative for chills and fever.  Gastrointestinal:  Positive for abdominal pain. Negative for blood in stool, nausea and vomiting.  Neurological:  Positive for dizziness (for the past year).        Objective:    BP (!) 164/88   Pulse 89   Temp (!) 96.9 F (36.1 C)   Resp 10   Ht $R'5\' 9"'Lj$  (1.753 m)   Wt 165 lb 4 oz (75 kg)   SpO2 95%   BMI 24.40 kg/m    Physical Exam Vitals and nursing note reviewed.  Constitutional:      Appearance: Normal appearance.  Cardiovascular:     Rate and Rhythm: Normal rate and regular rhythm.     Heart sounds: Normal heart sounds.  Pulmonary:     Effort: Pulmonary effort is normal.     Breath sounds: Normal breath sounds.  Abdominal:     General: Bowel sounds are normal. There is no distension.     Palpations: There is no mass.     Tenderness: There is no abdominal tenderness.     Hernia: No hernia is present.  Neurological:     Mental Status: He is alert.     No results found for any visits on 11/19/21.      Assessment & Plan:   Problem List Items Addressed This Visit        Other   Abnormal stools - Primary    Patient saw a white object on the outside of the stool approximately 2 inches in length.  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  We will send off ova parasite kit.  Defer treatment currently.  Patient is asymptomatic currently.  Did go over proper hygiene when handling raw meats prior to touching face or mouth.  Pending tests      Relevant Orders   Ova and parasite examination    No orders of the defined types were placed in this encounter.   Return if symptoms worsen or fail to improve.  Romilda Garret, NP

## 2021-11-19 NOTE — Addendum Note (Signed)
Addended by: Kris Mouton on: 11/19/2021 03:01 PM   Modules accepted: Orders

## 2021-11-19 NOTE — Patient Instructions (Signed)
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

## 2021-11-19 NOTE — Assessment & Plan Note (Signed)
Patient saw a white object on the outside of the stool approximately 2 inches in length.  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  We will send off ova parasite kit.  Defer treatment currently.  Patient is asymptomatic currently.  Did go over proper hygiene when handling raw meats prior to touching face or mouth.  Pending tests

## 2021-12-01 LAB — OVA AND PARASITE EXAMINATION
CONCENTRATE RESULT:: NONE SEEN
MICRO NUMBER:: 13775202
SPECIMEN QUALITY:: ADEQUATE
TRICHROME RESULT:: NONE SEEN

## 2022-04-02 IMAGING — DX DG LUMBAR SPINE COMPLETE 4+V
5 series · 5 of 5 positions shown · non-contrast
Comparison: 03/31/2015

CLINICAL DATA: Back pain

EXAM:
LUMBAR SPINE - COMPLETE 4+ VIEW

[l-spine ap]
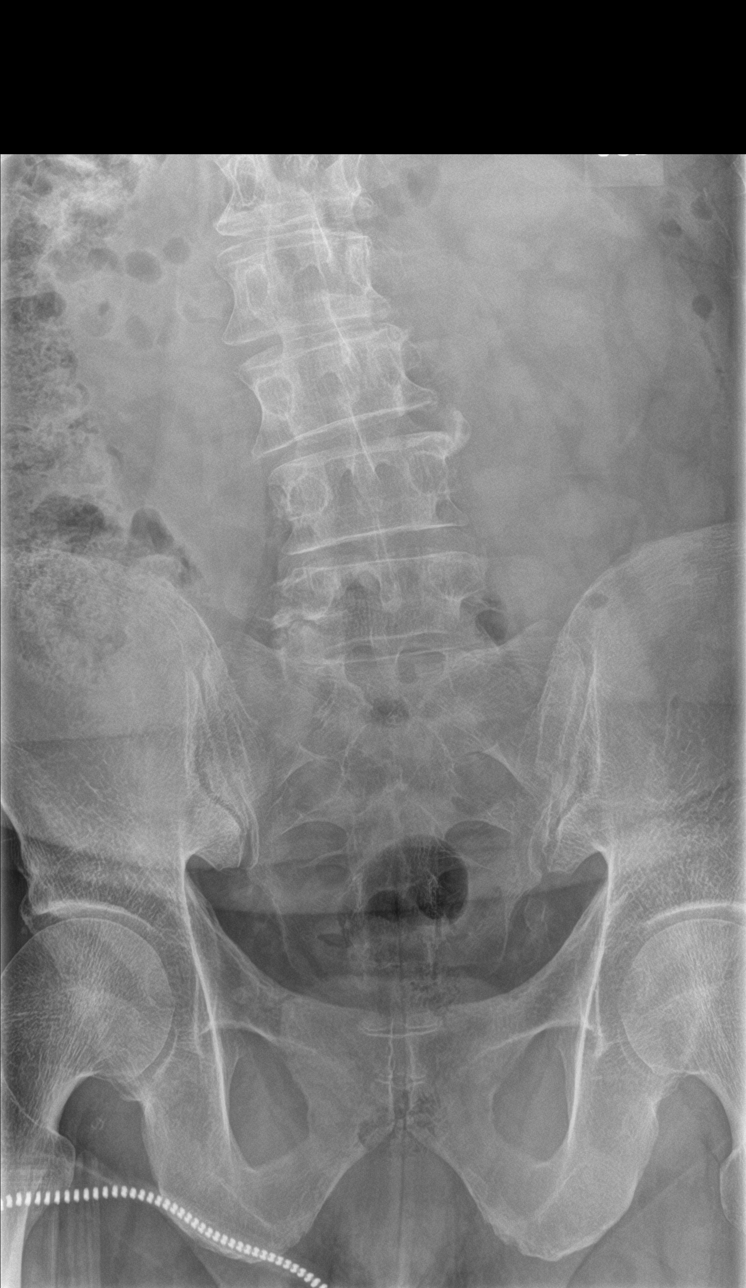

[l-spine obl (1 of 2)]
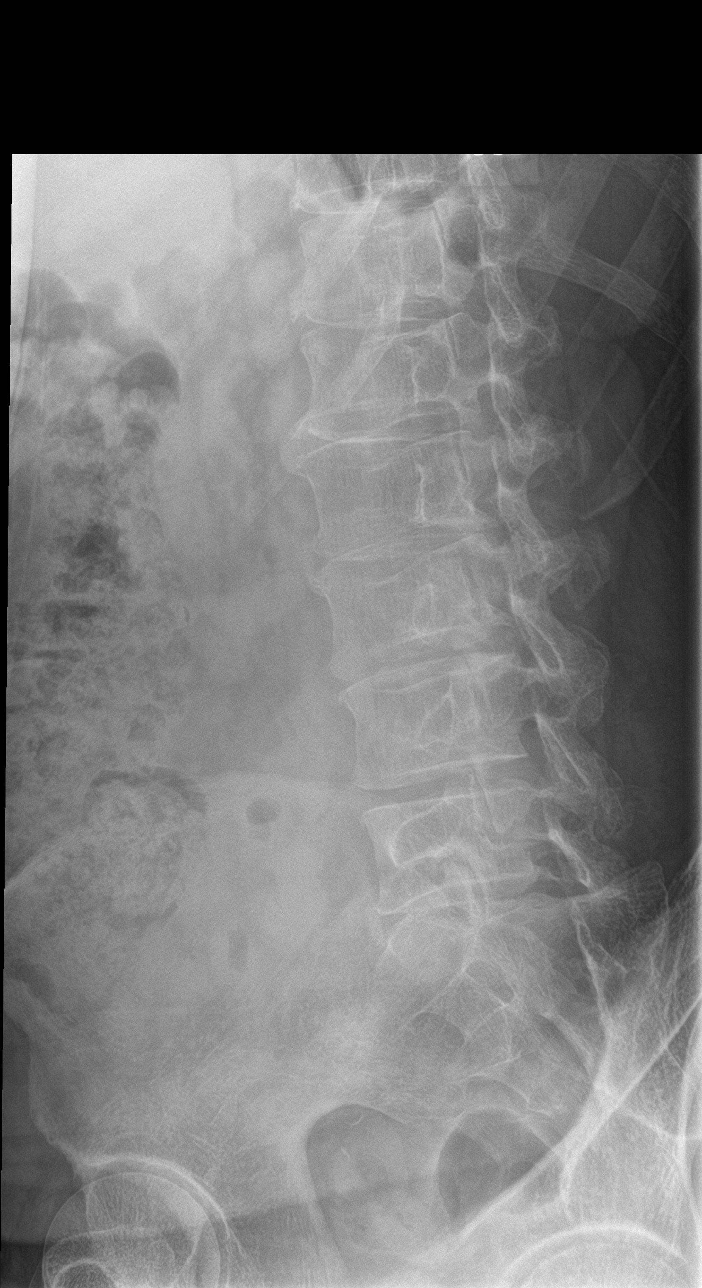

[l-spine obl (2 of 2)]
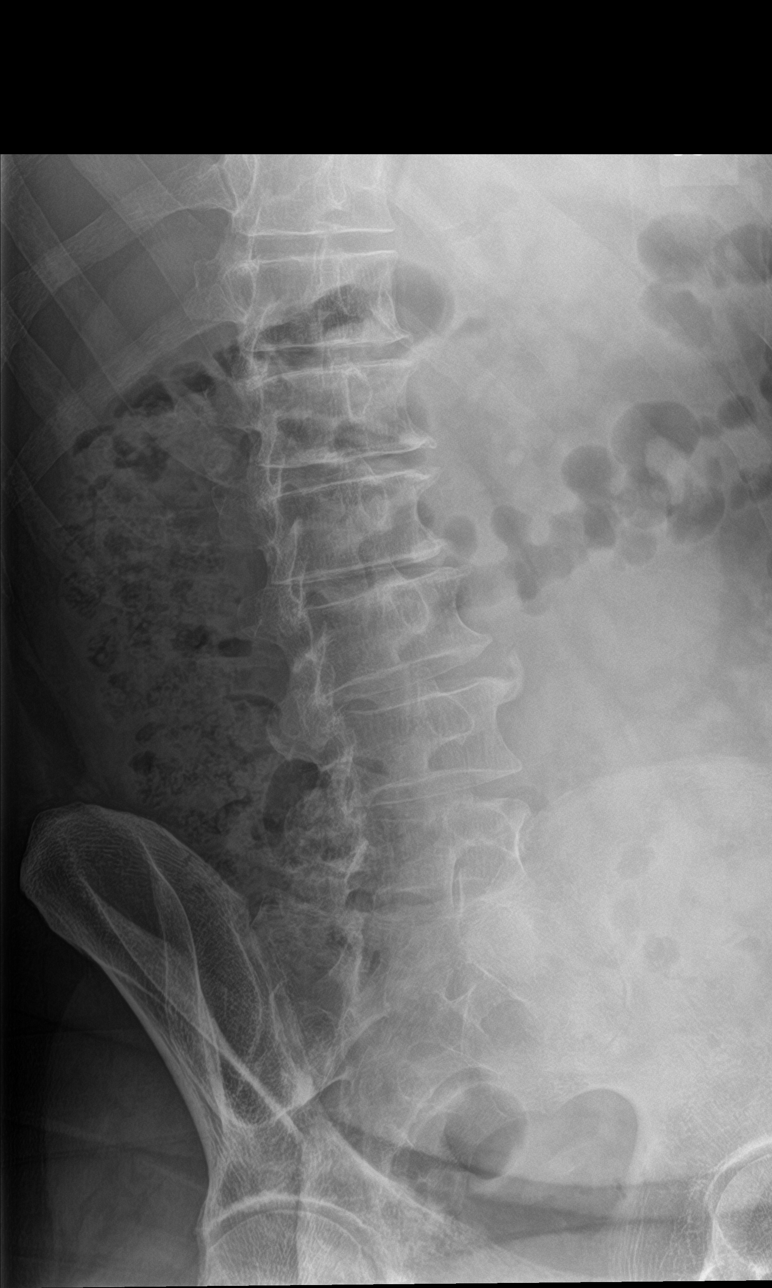

[l-spine lat]
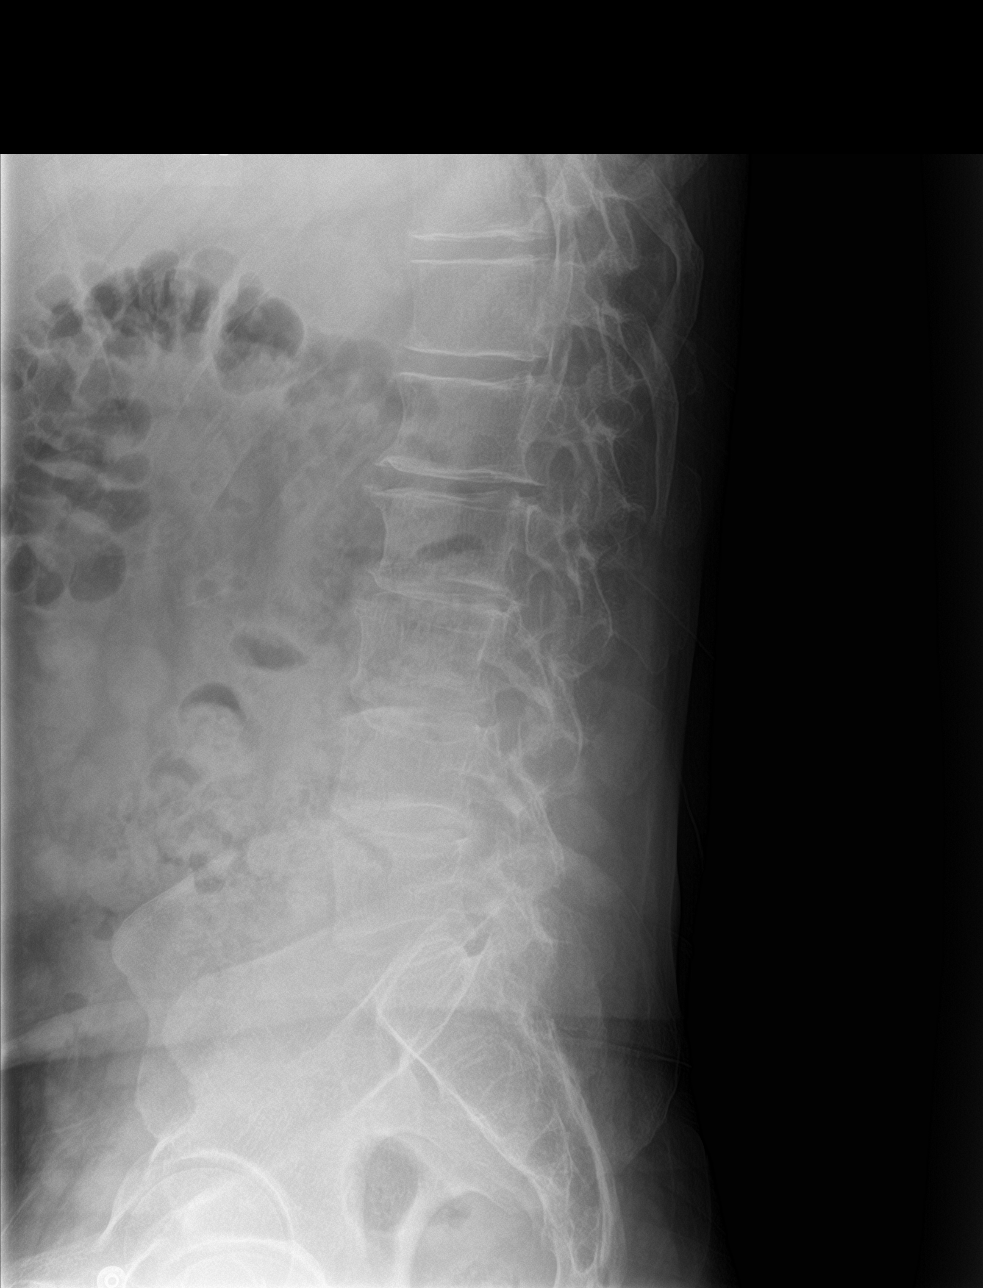

[l-spine spot]
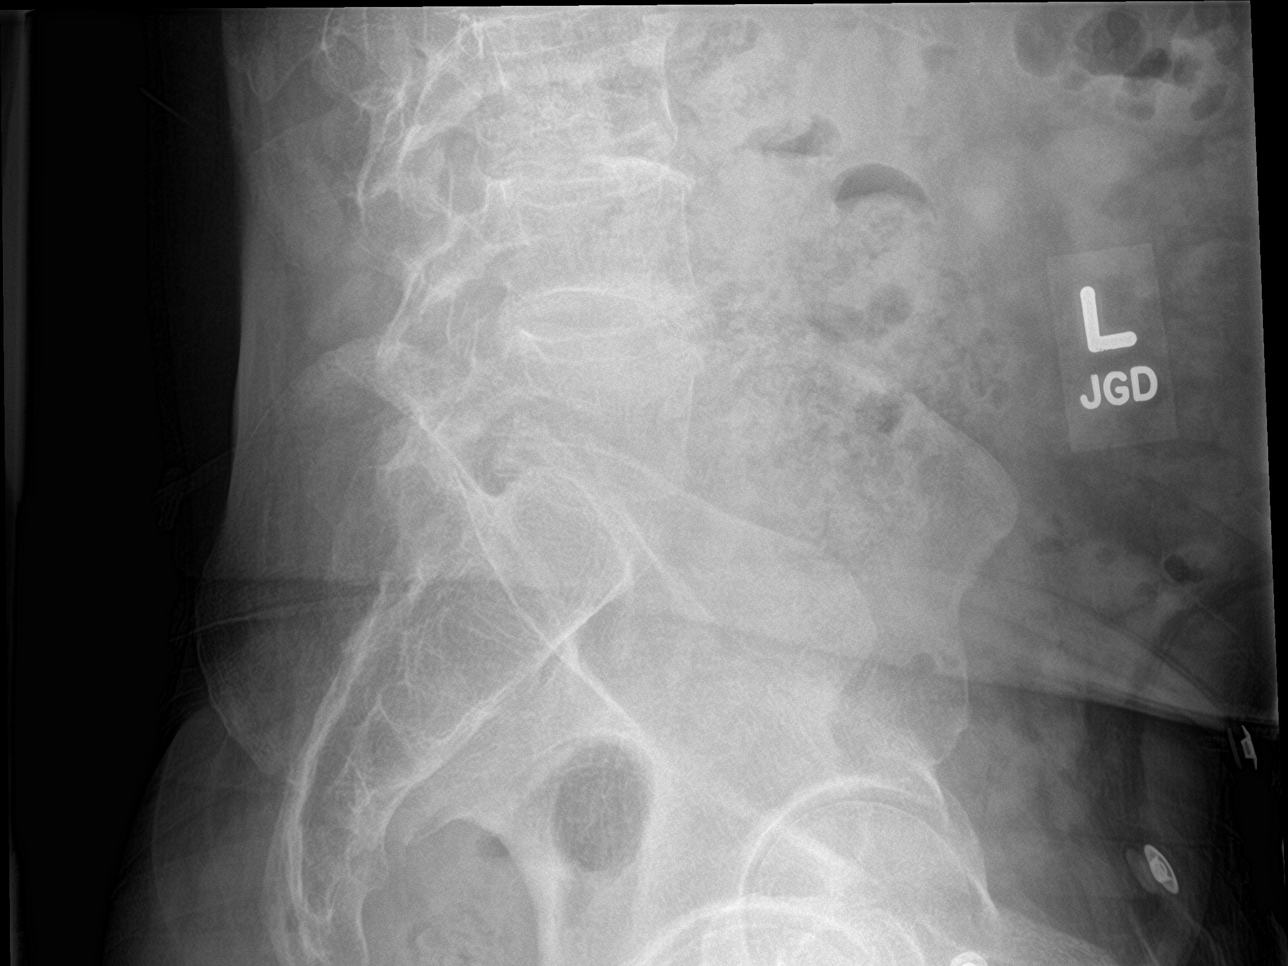

[5 of 5 positions shown; findings below may reference images not displayed]

FINDINGS: Frontal, bilateral oblique, lateral views of the lumbar spine are
obtained. There are 5 non-rib-bearing lumbar type vertebral bodies
identified, with right convex scoliosis centered at the L2 level
measuring approximately 14 degrees. Otherwise alignment is anatomic.
There are no acute displaced fractures. Mild diffuse spondylosis
most pronounced at the L1-2 and L2-3 levels. Mild facet hypertrophic
changes at L5-S1. Sacroiliac joints are normal.
IMPRESSION: 1. Right convex scoliosis centered at L2, measuring approximately 14
degrees.
2. Progressive but mild multilevel spondylosis and facet hypertrophy
as above.
3. No acute bony abnormalities.

## 2022-11-09 ENCOUNTER — Other Ambulatory Visit: Payer: Self-pay | Admitting: Family Medicine

## 2022-11-09 DIAGNOSIS — Z125 Encounter for screening for malignant neoplasm of prostate: Secondary | ICD-10-CM

## 2022-11-09 DIAGNOSIS — E785 Hyperlipidemia, unspecified: Secondary | ICD-10-CM

## 2022-11-11 ENCOUNTER — Other Ambulatory Visit (INDEPENDENT_AMBULATORY_CARE_PROVIDER_SITE_OTHER): Payer: No Typology Code available for payment source

## 2022-11-11 DIAGNOSIS — E785 Hyperlipidemia, unspecified: Secondary | ICD-10-CM | POA: Diagnosis not present

## 2022-11-11 DIAGNOSIS — Z125 Encounter for screening for malignant neoplasm of prostate: Secondary | ICD-10-CM | POA: Diagnosis not present

## 2022-11-11 LAB — LIPID PANEL
Cholesterol: 203 mg/dL — ABNORMAL HIGH (ref 0–200)
HDL: 56.1 mg/dL (ref >39.00)
LDL Cholesterol: 136 mg/dL — ABNORMAL HIGH (ref 0–99)
NonHDL: 146.53
Total CHOL/HDL Ratio: 4
Triglycerides: 52 mg/dL (ref 0.0–149.0)
VLDL: 10.4 mg/dL (ref 0.0–40.0)

## 2022-11-11 LAB — COMPREHENSIVE METABOLIC PANEL
ALT: 20 U/L (ref 0–53)
AST: 18 U/L (ref 0–37)
Albumin: 4.3 g/dL (ref 3.5–5.2)
Alkaline Phosphatase: 54 U/L (ref 39–117)
BUN: 24 mg/dL — ABNORMAL HIGH (ref 6–23)
CO2: 26 mEq/L (ref 19–32)
Calcium: 9.1 mg/dL (ref 8.4–10.5)
Chloride: 107 mEq/L (ref 96–112)
Creatinine, Ser: 1.07 mg/dL (ref 0.40–1.50)
GFR: 75.22 mL/min (ref >60.00)
Glucose, Bld: 108 mg/dL — ABNORMAL HIGH (ref 70–99)
Potassium: 4.4 mEq/L (ref 3.5–5.1)
Sodium: 139 mEq/L (ref 135–145)
Total Bilirubin: 0.7 mg/dL (ref 0.2–1.2)
Total Protein: 7.2 g/dL (ref 6.0–8.3)

## 2022-11-11 LAB — PSA: PSA: 3.67 ng/mL (ref 0.10–4.00)

## 2022-11-18 ENCOUNTER — Ambulatory Visit (INDEPENDENT_AMBULATORY_CARE_PROVIDER_SITE_OTHER): Payer: No Typology Code available for payment source | Admitting: Family Medicine

## 2022-11-18 ENCOUNTER — Encounter: Payer: Self-pay | Admitting: Family Medicine

## 2022-11-18 VITALS — BP 116/68 | HR 74 | Temp 98.1°F | Ht 69.0 in | Wt 159.0 lb

## 2022-11-18 DIAGNOSIS — Z0000 Encounter for general adult medical examination without abnormal findings: Secondary | ICD-10-CM

## 2022-11-18 DIAGNOSIS — E785 Hyperlipidemia, unspecified: Secondary | ICD-10-CM

## 2022-11-18 DIAGNOSIS — N521 Erectile dysfunction due to diseases classified elsewhere: Secondary | ICD-10-CM

## 2022-11-18 DIAGNOSIS — Z7189 Other specified counseling: Secondary | ICD-10-CM

## 2022-11-18 DIAGNOSIS — Z1211 Encounter for screening for malignant neoplasm of colon: Secondary | ICD-10-CM

## 2022-11-18 DIAGNOSIS — Z125 Encounter for screening for malignant neoplasm of prostate: Secondary | ICD-10-CM

## 2022-11-18 MED ORDER — SILDENAFIL CITRATE 20 MG PO TABS: 60.0000 mg | ORAL_TABLET | Freq: Every day | ORAL | 12 refills | Status: DC | PRN

## 2022-11-18 NOTE — Progress Notes (Unsigned)
CPE- See plan.  Routine anticipatory guidance given to patient.  See health maintenance.  The possibility exists that previously documented standard health maintenance information may have been brought forward from a previous encounter into this note.  If needed, that same information has been updated to reflect the current situation based on today's encounter.    Tetanus 2016 Flu encouraged.  PNA and shingles note due, d/w pt. Covid vaccine prev done.   D/w patient [id number] for colon cancer screening, including IFOB vs. colonoscopy.  Risks and benefits of both were discussed and patient voiced understanding.  Pt elects for: colonoscopy.   PSA wnl but higher than prev.  Rare LUTS, ie rarely slower stream Living will d/w pt.  Wife designated if patient were incapacitated.   Diet and exercise d/w pt.     Sugar and lipids were higher 2024.   We talked about sedation cautions re: colonoscopy.    ED improved with sildenafil w/o ADE on med.    No pulmonary sx.    PMH and SH reviewed  Meds, vitals, and allergies reviewed.   ROS: Per HPI.  Unless specifically indicated otherwise in HPI, the patient denies:  General: fever. Eyes: acute vision changes ENT: sore throat Cardiovascular: chest pain Respiratory: SOB GI: vomiting GU: dysuria Musculoskeletal: acute back pain Derm: acute rash Neuro: acute motor dysfunction Psych: worsening mood Endocrine: polydipsia Heme: bleeding Allergy: hayfever  GEN: nad, alert and oriented HEENT: mucous membranes moist NECK: supple w/o LA CV: rrr. PULM: ctab, no inc wob ABD: soft, +bs EXT: no edema SKIN: no acute rash

## 2022-11-18 NOTE — Patient Instructions (Addendum)
I would get a flu shot each fall.   Check with your insurance to see if they will cover the shingles shot. Recheck fasting labs in about 4 months.   Take care.  Glad to see you.

## 2022-11-20 NOTE — Assessment & Plan Note (Addendum)
  Tetanus 2016 Flu encouraged.  PNA and shingles note due, d/w pt. Covid vaccine prev done.   D/w patient [id number] for colon cancer screening, including IFOB vs. colonoscopy.  Risks and benefits of both were discussed and patient voiced understanding.  Pt elects for: colonoscopy.   PSA wnl but higher than prev.  Rare LUTS, ie rarely slower stream Living will d/w pt.  Wife designated if patient were incapacitated.   Diet and exercise d/w pt.     Sugar and lipids were higher 2024.  Diet and exercise discussed with patient.  Discussed recheck fasting labs in about 4 months, sugar lipids and PSA.

## 2022-11-20 NOTE — Assessment & Plan Note (Signed)
Living will d/w pt.  Wife designated if patient were incapacitated.   ?

## 2022-11-20 NOTE — Assessment & Plan Note (Signed)
ED improved with sildenafil w/o ADE on med.  Would continue as is.

## 2023-03-24 ENCOUNTER — Other Ambulatory Visit (INDEPENDENT_AMBULATORY_CARE_PROVIDER_SITE_OTHER): Payer: No Typology Code available for payment source

## 2023-03-24 DIAGNOSIS — E785 Hyperlipidemia, unspecified: Secondary | ICD-10-CM | POA: Diagnosis not present

## 2023-03-24 DIAGNOSIS — Z125 Encounter for screening for malignant neoplasm of prostate: Secondary | ICD-10-CM | POA: Diagnosis not present

## 2023-03-24 LAB — LIPID PANEL
Cholesterol: 201 mg/dL — ABNORMAL HIGH (ref 0–200)
HDL: 58.7 mg/dL (ref >39.00)
LDL Cholesterol: 134 mg/dL — ABNORMAL HIGH (ref 0–99)
NonHDL: 142.11
Total CHOL/HDL Ratio: 3
Triglycerides: 40 mg/dL (ref 0.0–149.0)
VLDL: 8 mg/dL (ref 0.0–40.0)

## 2023-03-24 LAB — PSA: PSA: 4.52 ng/mL — ABNORMAL HIGH (ref 0.10–4.00)

## 2023-03-24 LAB — GLUCOSE, RANDOM: Glucose, Bld: 104 mg/dL — ABNORMAL HIGH (ref 70–99)

## 2023-03-26 ENCOUNTER — Other Ambulatory Visit: Payer: Self-pay | Admitting: Family Medicine

## 2023-03-26 DIAGNOSIS — R972 Elevated prostate specific antigen [PSA]: Secondary | ICD-10-CM

## 2023-03-27 ENCOUNTER — Telehealth: Payer: Self-pay | Admitting: Family Medicine

## 2023-03-27 NOTE — Telephone Encounter (Signed)
Pt called in returning call regarding results. Pt had no questions/concerns. Call back # [phone].

## 2023-03-27 NOTE — Telephone Encounter (Signed)
Result note updated.

## 2023-04-01 ENCOUNTER — Encounter: Payer: Self-pay | Admitting: *Deleted

## 2023-07-19 ENCOUNTER — Encounter: Payer: Self-pay | Admitting: Family Medicine

## 2023-07-19 DIAGNOSIS — C61 Malignant neoplasm of prostate: Secondary | ICD-10-CM | POA: Insufficient documentation

## 2023-07-21 ENCOUNTER — Encounter: Payer: Self-pay | Admitting: Gastroenterology

## 2023-09-20 ENCOUNTER — Ambulatory Visit: Admitting: Gastroenterology

## 2023-09-20 ENCOUNTER — Encounter: Payer: Self-pay | Admitting: Gastroenterology

## 2023-09-20 VITALS — BP 122/68 | HR 67 | Ht 70.0 in | Wt 156.0 lb

## 2023-09-20 DIAGNOSIS — Z800 Family history of malignant neoplasm of digestive organs: Secondary | ICD-10-CM

## 2023-09-20 DIAGNOSIS — R1314 Dysphagia, pharyngoesophageal phase: Secondary | ICD-10-CM | POA: Diagnosis not present

## 2023-09-20 MED ORDER — NA SULFATE-K SULFATE-MG SULF 17.5-3.13-1.6 GM/177ML PO SOLN: 1.0000 | Freq: Once | ORAL | 0 refills | Status: AC

## 2023-09-20 NOTE — Progress Notes (Signed)
 [facility] Consult Note:  History: Dustin Nixon 09/20/2023  Referring provider: Donnie Galea, MD  Reason for consult/chief complaint: Colonoscopy (Pt states he would like to discuss a colon.)   Subjective  Prior history:  No prior GI history in this EHR Referred several times for colorectal cancer screening starting 2019, most recently August 2024   Discussed the use of AI scribe software for clinical note transcription with the patient, who gave verbal consent to proceed.  History of Present Illness  This is a very pleasant 63 year old man here today to discuss a colonoscopy in the setting of family history of colon cancer.  His sister was recently diagnosed with stage IV colorectal cancer, and this has heightened his concerns and awareness of colorectal cancer screening.  He had previously considered having it done but had concerns about the sedation, citing some bad experiences with anesthesia of 1 kind or another for prior surgical procedures.  (Prolonged period of sedation, and postanesthesia agitation on another)  He denies abdominal pain, altered bowel habits or current rectal bleeding.  He reports that for many years he has had what he believes to be hemorrhoids with some feeling of pressure or protrusion in the anorectal area.  He has had rare episodes of bleeding from that but not for the last couple years.  He also describes about 20 years of intermittently feeling food stuck in the neck/upper sternal area, more so if he does not chew food well or if he eats too quickly.  It has not progressed and he has not sought attention for it in the past.  Denies heartburn or regurgitation, nausea vomiting.  He had a reported purposeful 9 pound weight loss after a major change in diet was found to have prostate cancer 3 months ago, and that weight has since stabilized.   ROS:  Review of Systems  Constitutional:  Negative for appetite change and unexpected  weight change.  HENT:  Negative for mouth sores and voice change.   Eyes:  Negative for pain and redness.  Respiratory:  Negative for cough and shortness of breath.   Cardiovascular:  Negative for chest pain and palpitations.  Genitourinary:  Negative for dysuria and hematuria.  Musculoskeletal:  Negative for arthralgias and myalgias.  Skin:  Negative for pallor and rash.  Neurological:  Negative for weakness and headaches.  Hematological:  Negative for adenopathy.     Past Medical History: Past Medical History:  Diagnosis Date   Erectile dysfunction    Low testosterone  04/11/2009   Plantar fasciitis    Prostate cancer, recur risk not determined whether low, med or high (HCC)    Sarcoid      Past Surgical History: Past Surgical History:  Procedure Laterality Date   EYE SURGERY  ~2000   Right eye from injury   LUNG BIOPSY     sarcoid     Family History: Family History  Problem Relation Age of Onset   Parkinsonism Mother    Breast cancer Mother    Hypertension Father    Cancer Father        bone cancer   Colon cancer Sister    Prostate cancer Maternal Uncle    Colon cancer Paternal Aunt    Prostate cancer Paternal Uncle    Liver disease Neg Hx    Esophageal cancer Neg Hx     Social History: Social History   Socioeconomic History   Marital status: Married    Spouse name: Not on  file   Number of children: 1   Years of education: Not on file   Highest education level: Not on file  Occupational History   Occupation: meat cutter  Tobacco Use   Smoking status: Never   Smokeless tobacco: Never  Vaping Use   Vaping status: Never Used  Substance and Sexual Activity   Alcohol use: Yes    Comment: rare   Drug use: No   Sexual activity: Not on file  Other Topics Concern   Not on file  Social History 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 354 mL 0   sildenafil  (REVATIO ) 20 MG tablet Take 3-5 tablets (60-100 mg total) by mouth daily as needed. 50 tablet 12   No current facility-administered medications for this visit.      ___________________________________________________________________ Objective   Exam:  BP 122/68   Pulse 67   Ht 5' 10 (1.778 m)   Wt 156 lb (70.8 kg)   BMI 22.38 kg/m  Wt Readings from Last 3 Encounters:  09/20/23 156 lb (70.8 kg)  11/18/22 159 lb (72.1 kg)  11/19/21 165 lb 4 oz (75 kg)    General: Well-appearing Eyes: sclera anicteric, no redness ENT: oral mucosa moist without lesions, no cervical or supraclavicular lymphadenopathy CV: Regular without appreciable murmur, no JVD, no peripheral edema Resp: clear to auscultation bilaterally, normal RR and effort noted GI: soft, no tenderness, with active bowel sounds. No guarding or palpable organomegaly noted. Skin; warm and dry, no rash or jaundice noted Neuro: awake, alert and oriented x 3. Normal gross motor function and fluent speech   Labs:     Latest Ref Rng & Units 02/19/2019    3:43 PM 12/04/2012    8:57 AM 09/14/2010    8:11 AM  CBC  WBC 4.0 - 10.5 K/uL 8.6  7.5  6.4   Hemoglobin 13.0 - 17.0 g/dL 69.6  29.5  28.4   Hematocrit 39.0 - 52.0 % 46.1  45.6  49.0   Platelets 150.0 - 400.0 K/uL 189.0  154.0  149.0       Latest Ref Rng & Units 03/24/2023    7:35 AM 11/11/2022    8:02 AM 11/04/2021    3:26 PM  CMP  Glucose 70 - 99 mg/dL 132  440  82   BUN 6 - 23 mg/dL  24  21   Creatinine 1.02 - 1.50 mg/dL  7.25   3.66   Sodium 440 - 145 mEq/L  139  139   Potassium 3.5 - 5.1 mEq/L  4.4  4.1   Chloride 96 - 112 mEq/L  107  103   CO2 19 - 32 mEq/L  26  27   Calcium 8.4 - 10.5 mg/dL  9.1  9.3   Total Protein 6.0 - 8.3 g/dL  7.2  7.4   Total Bilirubin 0.2 - 1.2 mg/dL  0.7  1.0   Alkaline Phos 39 -  117 U/L  54  71   AST 0 - 37 U/L  18  24   ALT 0 - 53 U/L  20  40       Encounter Diagnoses  Name Primary?   Family history of colon cancer Yes   Dysphagia, pharyngoesophageal phase     Assessment and Plan Assessment & Plan  Increased risk for colorectal cancer, colonoscopy recommended.  We discussed the prep, sedation and other issues related to the colonoscopy using diagram of the anatomy.  He was agreeable after a thorough discussion of procedure and risks.  The benefits and risks of the planned procedure(s) were described in detail with the patient or (when appropriate) their health care proxy.  Risks were outlined as including, but not limited to, bleeding, infection, perforation, adverse medication reaction leading to cardiac or pulmonary decompensation, pancreatitis (if ERCP).  The limitation of incomplete mucosal visualization was also discussed.  No guarantees or warranties were given.  I reassured him of the generally safe and good experience with propofol for endoscopic procedures in most patients.  Questions were answered and he felt reassured.  Regarding his longstanding dysphagia, difficult to tell if it is structural such as a web (seems quite proximal for stricture) or perhaps motility since cricopharyngeal dysfunction.  Recommended a barium esophagram, and he would like to think about it and check with his insurance over the coverage for that first.  Plan:  Colonoscopy scheduled  Thank you for the courtesy of this consult.  Please call me with any questions or concerns.  Kerby Pearson III  CC: Referring provider noted above

## 2023-09-20 NOTE — Patient Instructions (Addendum)
 You have been scheduled for a colonoscopy. Please follow written instructions given to you at your visit today.   If you use inhalers (even only as needed), please bring them with you on the day of your procedure.  DO NOT TAKE 7 DAYS PRIOR TO TEST- Trulicity (dulaglutide) Ozempic, Wegovy (semaglutide) Mounjaro (tirzepatide) Bydureon Bcise (exanatide extended release)  DO NOT TAKE 1 DAY PRIOR TO YOUR TEST Rybelsus (semaglutide) Adlyxin (lixisenatide) Victoza (liraglutide) Byetta (exanatide) ___________________________________________________________________________  _______________________________________________________  If your blood pressure at your visit was 140/90 or greater, please contact your primary care physician to follow up on this.  _____________________________________________________  If you are age 35 or older, your body mass index should be between 23-30. Your Body mass index is 22.38 kg/m. If this is out of the aforementioned range listed, please consider follow up with your Primary Care Provider.  If you are age 56 or younger, your body mass index should be between 19-25. Your Body mass index is 22.38 kg/m. If this is out of the aformentioned range listed, please consider follow up with your Primary Care Provider.   ________________________________________________________  The [facility] providers would like to encourage you to use MYCHART to communicate with providers for non-urgent requests or questions.  Due to long hold times on the telephone, sending your provider a message by [facility] may be a faster and more efficient way to get a response.  Please allow 48 business hours for a response.  Please remember that this is for non-urgent requests.  _______________________________________________________

## 2023-10-19 ENCOUNTER — Encounter: Payer: Self-pay | Admitting: Gastroenterology

## 2023-10-27 ENCOUNTER — Ambulatory Visit: Admitting: Gastroenterology

## 2023-10-27 ENCOUNTER — Encounter: Payer: Self-pay | Admitting: Gastroenterology

## 2023-10-27 VITALS — BP 102/54 | HR 66 | Temp 98.2°F | Resp 12 | Ht 70.0 in | Wt 156.0 lb

## 2023-10-27 DIAGNOSIS — D120 Benign neoplasm of cecum: Secondary | ICD-10-CM

## 2023-10-27 DIAGNOSIS — K648 Other hemorrhoids: Secondary | ICD-10-CM | POA: Diagnosis not present

## 2023-10-27 DIAGNOSIS — Z1211 Encounter for screening for malignant neoplasm of colon: Secondary | ICD-10-CM

## 2023-10-27 DIAGNOSIS — K6389 Other specified diseases of intestine: Secondary | ICD-10-CM

## 2023-10-27 DIAGNOSIS — Z800 Family history of malignant neoplasm of digestive organs: Secondary | ICD-10-CM | POA: Diagnosis not present

## 2023-10-27 MED ORDER — SODIUM CHLORIDE 0.9 % IV SOLN: 500.0000 mL | Freq: Once | INTRAVENOUS | Status: DC

## 2023-10-27 NOTE — Progress Notes (Signed)
 History and Physical:  This patient presents for endoscopic testing for: Encounter Diagnosis  Name Primary?   Family history of colon cancer Yes    Colonoscopy today for Fam Hx CRC, clinical details in 09/20/23 office consult note No significant changes since then  Patient is otherwise without complaints or active issues today.   Past Medical History: Past Medical History:  Diagnosis Date   Erectile dysfunction    Low testosterone  04/11/2009   Plantar fasciitis    Prostate cancer, recur risk not determined whether low, med or high (HCC)    Sarcoid      Past Surgical History: Past Surgical History:  Procedure Laterality Date   EYE SURGERY  ~2000   Right eye from injury   LUNG BIOPSY     sarcoid    Allergies: Not on File  Outpatient Meds: Current Outpatient Medications  Medication Sig Dispense Refill   sildenafil  (REVATIO ) 20 MG tablet Take 3-5 tablets (60-100 mg total) by mouth daily as needed. 50 tablet 12   Current Facility-Administered Medications  Medication Dose Route Frequency Provider Last Rate Last Admin   0.9 %  sodium chloride infusion  500 mL Intravenous Once Danis, Victory LITTIE MOULD, MD          ___________________________________________________________________ Objective   Exam:  BP 128/66 (BP Location: Right Arm, Patient Position: Sitting, Cuff Size: Normal)   Pulse 70   Temp 98.2 F (36.8 C) (Temporal)   Ht 5' 10 (1.778 m)   Wt 156 lb (70.8 kg)   SpO2 98%   BMI 22.38 kg/m   CV: regular , S1/S2 Resp: clear to auscultation bilaterally, normal RR and effort noted GI: soft, no tenderness, with active bowel sounds.   Assessment: Encounter Diagnosis  Name Primary?   Family history of colon cancer Yes     Plan: Colonoscopy   The benefits and risks of the planned procedure(s) were described in detail with the patient or (when appropriate) their health care proxy.  Risks were outlined as including, but not limited to, bleeding, infection,  perforation, adverse medication reaction leading to cardiac or pulmonary decompensation, pancreatitis (if ERCP).  The limitation of incomplete mucosal visualization was also discussed.  No guarantees or warranties were given.  The patient is appropriate for an endoscopic procedure in the ambulatory setting.   - Victory Brand, MD

## 2023-10-27 NOTE — Progress Notes (Signed)
 Sedate, gd SR, tolerated procedure well, VSS, report to RN

## 2023-10-27 NOTE — Op Note (Signed)
 [facility] Patient Name: Dustin Nixon Procedure Date: 10/27/2023 1:17 PM MRN: [id number] Endoscopist: Victory L. Legrand , MD, [id number] Age: 63 Referring MD:  Date of Birth: 06-30-1960 Gender: Male Account #: [id number] Procedure:                Colonoscopy Indications:              Colon cancer screening in patient at increased                            risk: Colorectal cancer in sister Medicines:                Monitored Anesthesia Care Procedure:                Pre-Anesthesia Assessment:                           - Prior to the procedure, a History and Physical                            was performed, and patient medications and                            allergies were reviewed. The patient's tolerance of                            previous anesthesia was also reviewed. The risks                            and benefits of the procedure and the sedation                            options and risks were discussed with the patient.                            All questions were answered, and informed consent                            was obtained. Prior Anticoagulants: The patient has                            taken no anticoagulant or antiplatelet agents. ASA                            Grade Assessment: II - A patient with mild systemic                            disease. After reviewing the risks and benefits,                            the patient was deemed in satisfactory condition to                            undergo the procedure.  After obtaining informed consent, the colonoscope                            was passed under direct vision. Throughout the                            procedure, the patient's blood pressure, pulse, and                            oxygen saturations were monitored continuously. The                            CF [id number] #[id number] was introduced through the anus                            and advanced to the the  cecum, identified by                            appendiceal orifice and ileocecal valve. The                            colonoscopy was performed without difficulty. The                            patient tolerated the procedure well. The quality                            of the bowel preparation was good. The ileocecal                            valve, appendiceal orifice, and rectum were                            photographed. The bowel preparation used was SUPREP. Scope In: 1:25:13 PM Scope Out: 1:41:58 PM Scope Withdrawal Time: 0 hours 13 minutes 19 seconds  Total Procedure Duration: 0 hours 16 minutes 45 seconds  Findings:                 The perianal and digital rectal examinations were                            normal.                           Repeat examination of right colon under NBI                            performed.                           A diminutive polyp was found in the cecum. The                            polyp was sessile. The polyp was removed with a  cold snare. Resection and retrieval were complete.                           Internal hemorrhoids were found. The hemorrhoids                            were large.                           The exam was otherwise without abnormality on                            direct and retroflexion views. Complications:            No immediate complications. Estimated Blood Loss:     Estimated blood loss was minimal. Impression:               - One diminutive polyp in the cecum, removed with a                            cold snare. Resected and retrieved.                           - Internal hemorrhoids.                           - The examination was otherwise normal on direct                            and retroflexion views. Recommendation:           - Patient has a contact number available for                            emergencies. The signs and symptoms of potential                             delayed complications were discussed with the                            patient. Return to normal activities tomorrow.                            Written discharge instructions were provided to the                            patient.                           - Resume previous diet.                           - Continue present medications.                           - Await pathology results.                           -  Repeat colonoscopy in 5 years for surveillance                            and family history. Tawnie Ehresman L. Legrand, MD 10/27/2023 1:44:56 PM This report has been signed electronically.

## 2023-10-27 NOTE — Progress Notes (Signed)
 Called to room to assist during endoscopic procedure.  Patient ID and intended procedure confirmed with present staff. Received instructions for my participation in the procedure from the performing physician.

## 2023-10-27 NOTE — Progress Notes (Signed)
 Vitals-AH  Pt's states no medical or surgical changes since previsit or office visit.

## 2023-10-27 NOTE — Patient Instructions (Addendum)
 Resume previous diet Continue present medications Await pathology results  Repeat colonoscopy in 5 years for surveillance and family history  See handout for polyps  YOU HAD AN ENDOSCOPIC PROCEDURE TODAY AT THE [facility]:   Refer to the procedure report that was given to you for any specific questions about what was found during the examination.  If the procedure report does not answer your questions, please call your gastroenterologist to clarify.  If you requested that your care partner not be given the details of your procedure findings, then the procedure report has been included in a sealed envelope for you to review at your convenience later.  YOU SHOULD EXPECT: Some feelings of bloating in the abdomen. Passage of more gas than usual.  Walking can help get rid of the air that was put into your GI tract during the procedure and reduce the bloating. If you had a lower endoscopy (such as a colonoscopy or flexible sigmoidoscopy) you may notice spotting of blood in your stool or on the toilet paper. If you underwent a bowel prep for your procedure, you may not have a normal bowel movement for a few days.  Please Note:  You might notice some irritation and congestion in your nose or some drainage.  This is from the oxygen used during your procedure.  There is no need for concern and it should clear up in a day or so.  SYMPTOMS TO REPORT IMMEDIATELY:  Following lower endoscopy (colonoscopy or flexible sigmoidoscopy):  Excessive amounts of blood in the stool  Significant tenderness or worsening of abdominal pains  Swelling of the abdomen that is new, acute  Fever of 100F or higher  For urgent or emergent issues, a gastroenterologist can be reached at any hour by calling [phone]. Do not use MyChart messaging for urgent concerns.   DIET:  We do recommend a small meal at first, but then you may proceed to your regular diet.  Drink plenty of fluids but you should avoid  alcoholic beverages for 24 hours.  ACTIVITY:  You should plan to take it easy for the rest of today and you should NOT DRIVE or use heavy machinery until tomorrow (because of the sedation medicines used during the test).    FOLLOW UP: Our staff will call the number listed on your records the next business day following your procedure.  We will call around 7:15- 8:00 am to check on you and address any questions or concerns that you may have regarding the information given to you following your procedure. If we do not reach you, we will leave a message.     If any biopsies were taken you will be contacted by phone or by letter within the next 1-3 weeks.  Please call us  at [phone] if you have not heard about the biopsies in 3 weeks.   SIGNATURES/CONFIDENTIALITY: You and/or your care partner have signed paperwork which will be entered into your electronic medical record.  These signatures attest to the fact that that the information above on your After Visit Summary has been reviewed and is understood.  Full responsibility of the confidentiality of this discharge information lies with you and/or your care-partner.

## 2023-10-30 ENCOUNTER — Telehealth: Payer: Self-pay

## 2023-10-30 NOTE — Telephone Encounter (Signed)
  Follow up Call-     10/27/2023   12:30 PM 10/27/2023   12:25 PM  Call back number  Post procedure Call Back phone  # [phone]   Permission to leave phone message  Yes     Patient questions:  Do you have a fever, pain , or abdominal swelling? No. Pain Score  0 *  Have you tolerated food without any problems? Yes.    Have you been able to return to your normal activities? Yes.    Do you have any questions about your discharge instructions: Diet   No. Medications  No. Follow up visit  No.  Do you have questions or concerns about your Care? No.  Actions: * If pain score is 4 or above: No action needed, pain <4.

## 2023-11-03 LAB — SURGICAL PATHOLOGY

## 2023-11-06 ENCOUNTER — Ambulatory Visit: Payer: Self-pay | Admitting: Gastroenterology

## 2023-11-24 ENCOUNTER — Encounter: Admitting: Family Medicine

## 2023-11-24 ENCOUNTER — Telehealth: Payer: Self-pay | Admitting: Family Medicine

## 2023-11-24 NOTE — Telephone Encounter (Signed)
 On 8/14 I spoke with patient and it was difficult to find an open appointment. He then accepted 8/29 to then go and cancel the appointment. I finally was able to find him an appointment that was suitable for 8/22. Patient accepted the appointment. But if he cancels he is aware that he will probably not be scheduled in August

## 2023-11-24 NOTE — Telephone Encounter (Signed)
 Copied from CRM [id number]. Topic: Appointments - Appointment Cancel/Reschedule >> Nov 24, 2023  8:33 AM Viola F wrote: Patient needs physical before 12/10/23 due to insurance purposes - please call him at [phone] (M), he wants to know if he's able to see another provider since Dr. Cleatus is not available.   Is there any chance pt can be squeezed in sooner?

## 2023-11-24 NOTE — Telephone Encounter (Signed)
 error

## 2023-12-01 ENCOUNTER — Encounter: Payer: Self-pay | Admitting: Family Medicine

## 2023-12-01 ENCOUNTER — Ambulatory Visit (INDEPENDENT_AMBULATORY_CARE_PROVIDER_SITE_OTHER): Admitting: Family Medicine

## 2023-12-01 VITALS — BP 120/56 | HR 58 | Temp 97.9°F | Ht 69.69 in | Wt 155.4 lb

## 2023-12-01 DIAGNOSIS — N521 Erectile dysfunction due to diseases classified elsewhere: Secondary | ICD-10-CM

## 2023-12-01 DIAGNOSIS — D869 Sarcoidosis, unspecified: Secondary | ICD-10-CM

## 2023-12-01 DIAGNOSIS — E785 Hyperlipidemia, unspecified: Secondary | ICD-10-CM | POA: Diagnosis not present

## 2023-12-01 DIAGNOSIS — Z0000 Encounter for general adult medical examination without abnormal findings: Secondary | ICD-10-CM

## 2023-12-01 DIAGNOSIS — Z7189 Other specified counseling: Secondary | ICD-10-CM

## 2023-12-01 DIAGNOSIS — C61 Malignant neoplasm of prostate: Secondary | ICD-10-CM

## 2023-12-01 LAB — LIPID PANEL
Cholesterol: 157 mg/dL (ref 0–200)
HDL: 55.2 mg/dL (ref >39.00)
LDL Cholesterol: 95 mg/dL (ref 0–99)
NonHDL: 102.21
Total CHOL/HDL Ratio: 3
Triglycerides: 37 mg/dL (ref 0.0–149.0)
VLDL: 7.4 mg/dL (ref 0.0–40.0)

## 2023-12-01 LAB — COMPREHENSIVE METABOLIC PANEL WITH GFR
ALT: 32 U/L (ref 0–53)
AST: 25 U/L (ref 0–37)
Albumin: 4.3 g/dL (ref 3.5–5.2)
Alkaline Phosphatase: 51 U/L (ref 39–117)
BUN: 20 mg/dL (ref 6–23)
CO2: 29 meq/L (ref 19–32)
Calcium: 9.1 mg/dL (ref 8.4–10.5)
Chloride: 106 meq/L (ref 96–112)
Creatinine, Ser: 0.89 mg/dL (ref 0.40–1.50)
GFR: 92.2 mL/min (ref >60.00)
Glucose, Bld: 88 mg/dL (ref 70–99)
Potassium: 4.3 meq/L (ref 3.5–5.1)
Sodium: 141 meq/L (ref 135–145)
Total Bilirubin: 1.1 mg/dL (ref 0.2–1.2)
Total Protein: 7.2 g/dL (ref 6.0–8.3)

## 2023-12-01 MED ORDER — SILDENAFIL CITRATE 20 MG PO TABS: 60.0000 mg | ORAL_TABLET | Freq: Every day | ORAL | 12 refills | Status: AC | PRN

## 2023-12-01 NOTE — Progress Notes (Signed)
 CPE- See plan.  Routine anticipatory guidance given to patient.  See health maintenance.  The possibility exists that previously documented standard health maintenance information may have been brought forward from a previous encounter into this note.  If needed, that same information has been updated to reflect the current situation based on today's encounter.     Tetanus 2016 Flu encouraged.  PNA and shingles d/w pt.   Covid vaccine prev done.   Colonoscopy 2025.  PSA per urology.   Living will d/w pt.  Wife designated if patient were incapacitated.   Diet and exercise d/w pt.     H/o mild HLD, recheck labs pending.  D/w pt about diet.   ED improved with sildenafil  w/o ADE on med.    He is on active surveillance per urology.    Not yet on flomax, med list updated.     H/o sarcoid.  No pulmonary sx.  D/w pt.     PMH and SH reviewed   Meds, vitals, and allergies reviewed.    ROS: Per HPI.  Unless specifically indicated otherwise in HPI, the patient denies:   General: fever. Eyes: acute vision changes ENT: sore throat Cardiovascular: chest pain Respiratory: SOB GI: vomiting GU: dysuria Musculoskeletal: acute back pain Derm: acute rash Neuro: acute motor dysfunction Psych: worsening mood Endocrine: polydipsia Heme: bleeding Allergy: hayfever   GEN: nad, alert and oriented HEENT: ncat NECK: supple w/o LA CV: rrr. PULM: ctab, no inc wob ABD: soft, +bs EXT: no edema SKIN: well perfused.

## 2023-12-01 NOTE — Patient Instructions (Signed)
 Go to the lab on the way out.   If you have mychart we'll likely use that to update you.    I would get a flu shot each fall.   Take care.  Glad to see you.

## 2023-12-03 ENCOUNTER — Ambulatory Visit: Payer: Self-pay | Admitting: Family Medicine

## 2023-12-03 DIAGNOSIS — E785 Hyperlipidemia, unspecified: Secondary | ICD-10-CM | POA: Insufficient documentation

## 2023-12-03 NOTE — Assessment & Plan Note (Signed)
 Tetanus 2016 Flu encouraged.  PNA and shingles d/w pt.   Covid vaccine prev done.   Colonoscopy 2025.  PSA per urology.   Living will d/w pt.  Wife designated if patient were incapacitated.   Diet and exercise d/w pt.

## 2023-12-03 NOTE — Assessment & Plan Note (Signed)
 On active surveillance per urology.  D/w pt.

## 2023-12-03 NOTE — Assessment & Plan Note (Signed)
 Living will d/w pt.  Wife designated if patient were incapacitated.   ?

## 2023-12-03 NOTE — Assessment & Plan Note (Signed)
ED improved with sildenafil w/o ADE on med.   Continue as is.

## 2023-12-03 NOTE — Assessment & Plan Note (Signed)
 No pulmonary sx.  D/w pt.   Ctab.  I asked him to update me as needed.

## 2023-12-03 NOTE — Assessment & Plan Note (Signed)
 H/o mild HLD, recheck labs pending.

## 2023-12-08 ENCOUNTER — Encounter: Admitting: Family Medicine

## 2024-02-15 ENCOUNTER — Other Ambulatory Visit: Payer: Self-pay | Admitting: Urology

## 2024-02-15 DIAGNOSIS — C61 Malignant neoplasm of prostate: Secondary | ICD-10-CM

## 2024-02-16 ENCOUNTER — Encounter: Payer: Self-pay | Admitting: Urology

## 2024-04-15 ENCOUNTER — Ambulatory Visit
Admission: RE | Admit: 2024-04-15 | Discharge: 2024-04-15 | Disposition: A | Discharge status: Home / Self Care | Source: Ambulatory Visit | Attending: Urology | Admitting: Urology

## 2024-04-15 DIAGNOSIS — C61 Malignant neoplasm of prostate: Secondary | ICD-10-CM

## 2024-04-24 ENCOUNTER — Inpatient Hospital Stay
Admission: RE | Admit: 2024-04-24 | Discharge: 2024-04-24 | Disposition: A | Source: Ambulatory Visit | Attending: Urology | Admitting: Urology

## 2024-04-24 MED ORDER — GADOPICLENOL 0.5 MMOL/ML IV SOLN
7.5000 mL | Freq: Once | INTRAVENOUS | Status: AC | PRN
  Administered 2024-04-24: 7 mL via INTRAVENOUS
